# Patient Record
Sex: Female | Born: 1985 | Hispanic: Yes | Marital: Married | Smoking: Never smoker
Health system: Southern US, Community
[De-identification: ages and names within clinical notes are randomized; demographics above are authoritative.]

## PROBLEM LIST (undated history)

## (undated) DIAGNOSIS — Z789 Other specified health status: Secondary | ICD-10-CM

---

## 2008-06-11 ENCOUNTER — Emergency Department: Payer: Self-pay | Admitting: Emergency Medicine

## 2012-07-08 ENCOUNTER — Ambulatory Visit: Payer: Self-pay | Admitting: Primary Care

## 2012-11-18 ENCOUNTER — Ambulatory Visit: Payer: Self-pay | Admitting: Family Medicine

## 2012-11-22 IMAGING — US US OB < 14 WEEKS - US OB TV
1 series · 14 of 28 positions shown · non-contrast
Comparison: none

REASON FOR EXAM: Call report  [id number]  6 wks  viability spotting
pelvic pain
COMMENTS:

PROCEDURE:     US  - US OB LESS THAN 14 WEEKS/W TRANS  - July 08, 2012  [DATE]
RESULT:

[Series 1: us ob < 14 weeks - us ob tv · 0.26mm/px · 14 of 85 slices shown]
[im 4/85]
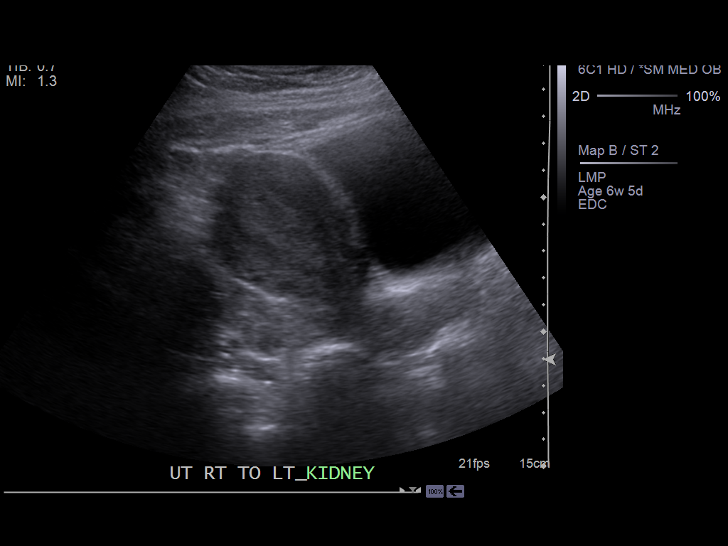
[im 10/85]
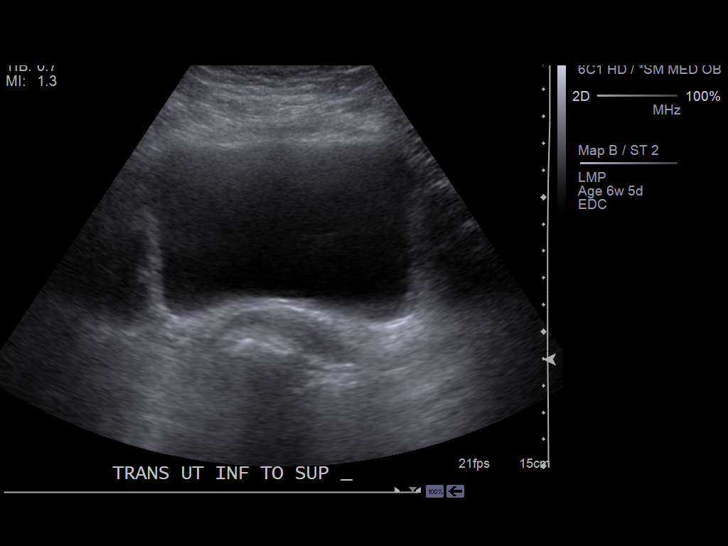
[im 16/85]
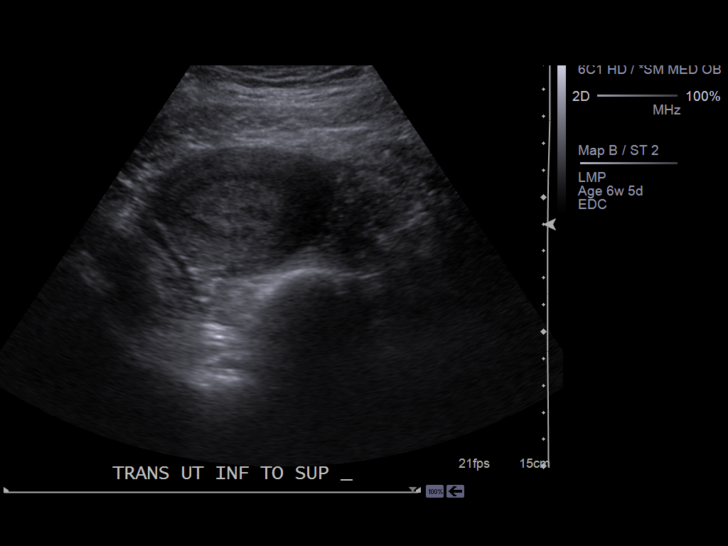
[im 22/85]
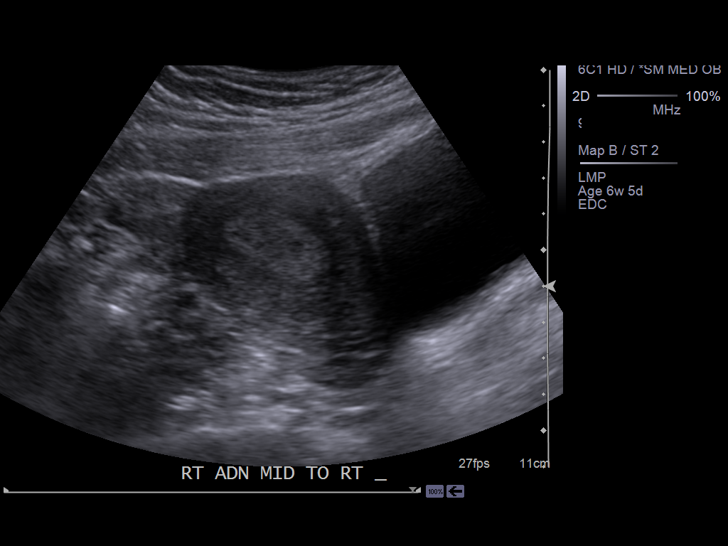
[im 29/85]
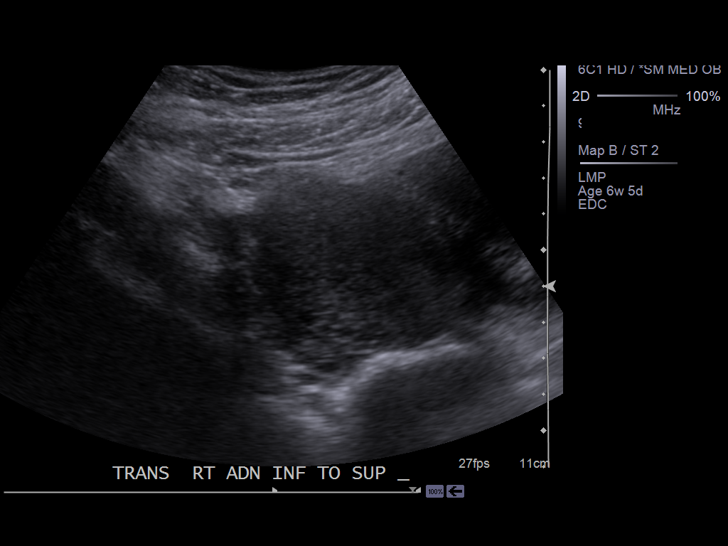
[im 35/85]
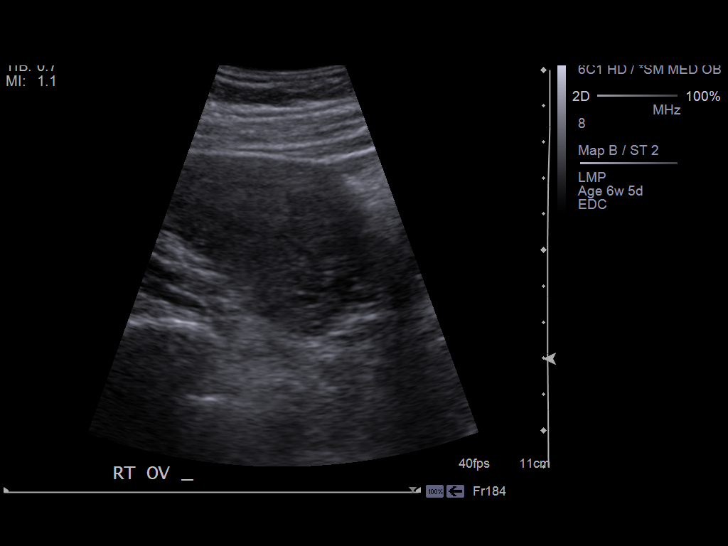
[im 41/85]
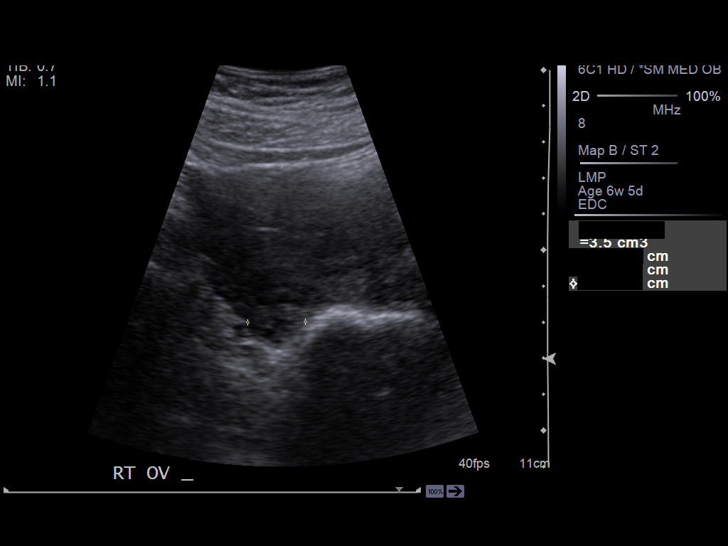
[im 47/85]
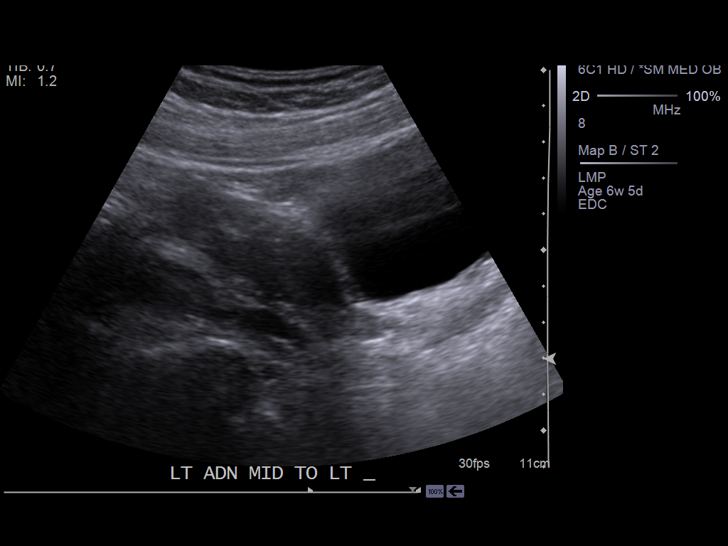
[im 53/85]
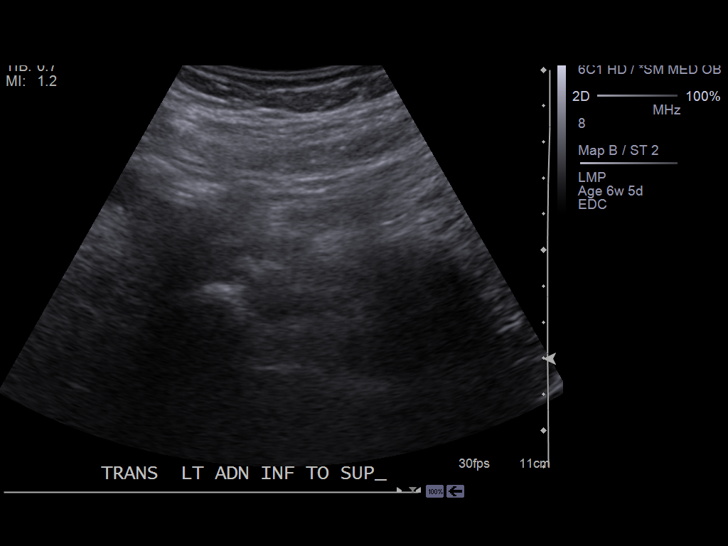
[im 60/85]
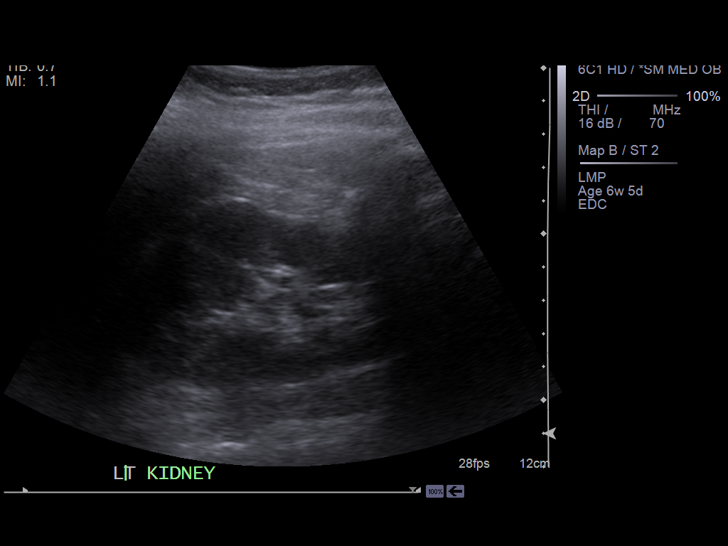
[im 66/85]
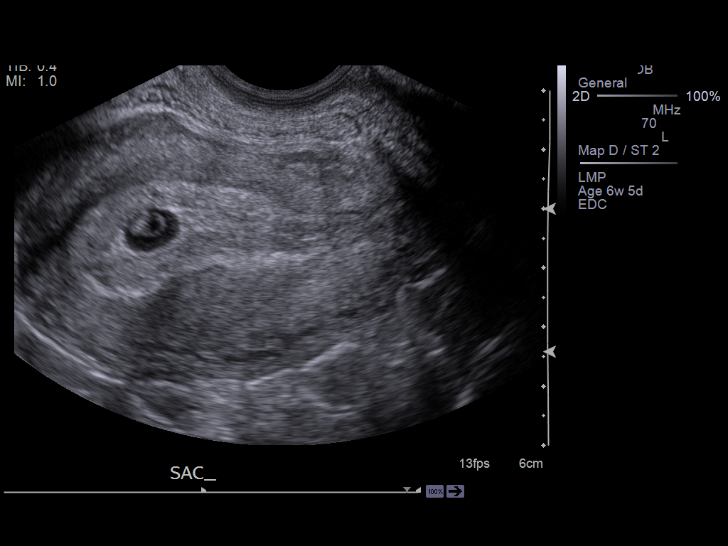
[im 72/85]
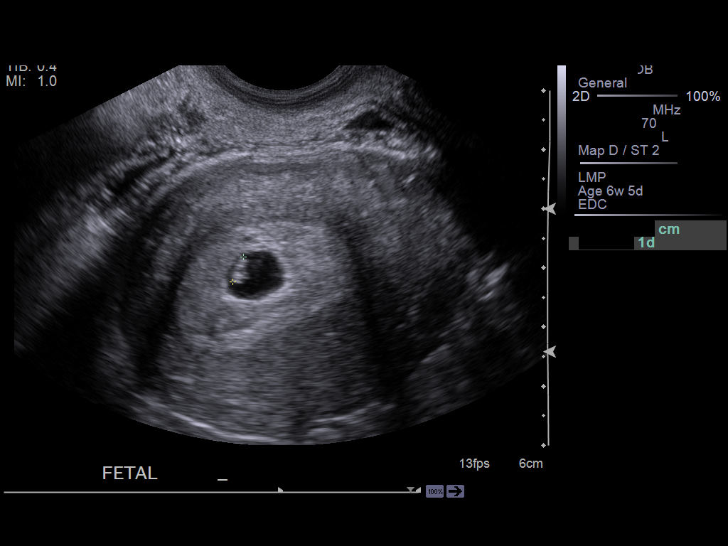
[im 78/85]
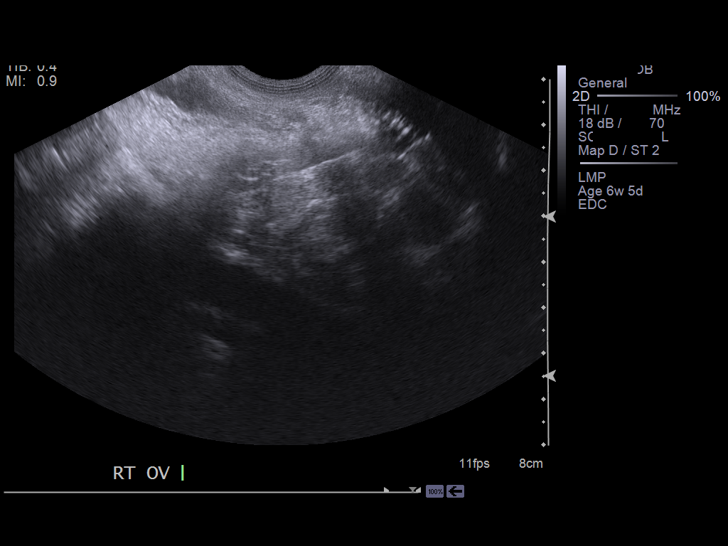
[im 85/85]
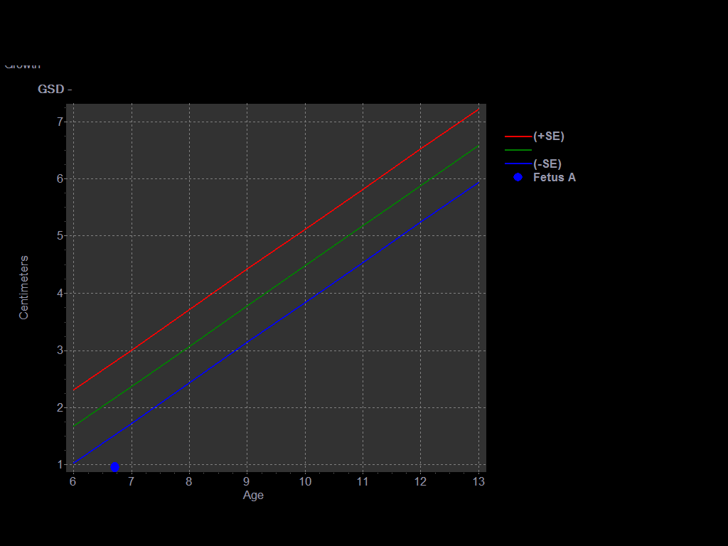

[14 of 28 positions shown; findings below may reference images not displayed]

FINDINGS: Single, viable intrauterine pregnancy is appreciated. Fetal heart
rate is 118 beats per minute. Estimated gestational age is 6 weeks, 1 day. A
yolk sac is identified. There is no evidence of adnexal masses or cysts.
There is no evidence of cul-de-sac fluid. The ovaries are unremarkable.
IMPRESSION: Single, viable intrauterine pregnancy as described above.

## 2013-04-04 IMAGING — US TRANSABDOMINAL ULTRASOUND OF PELVIS
1 series · 14 of 25 positions shown · non-contrast
Comparison: none

REASON FOR EXAM: Dysmenorrhea
COMMENTS:

[Series 1: transabdominal ultrasound of pelvis · 0.28mm/px · 14 of 79 slices shown]
[im 1/79]
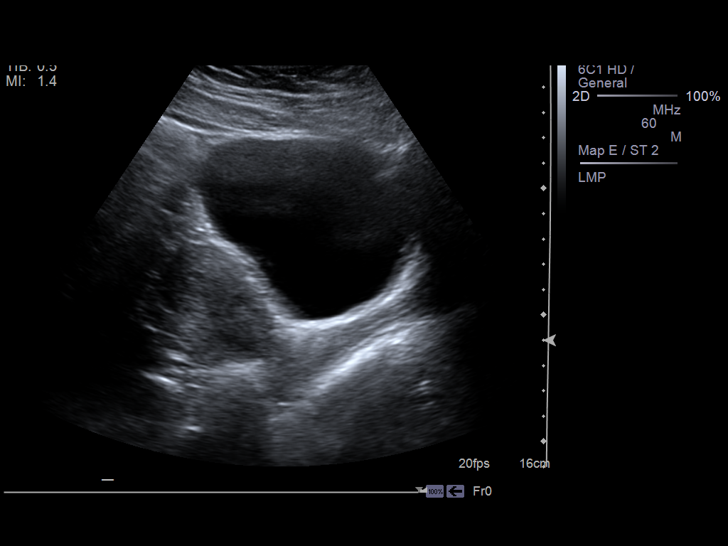
[im 7/79]
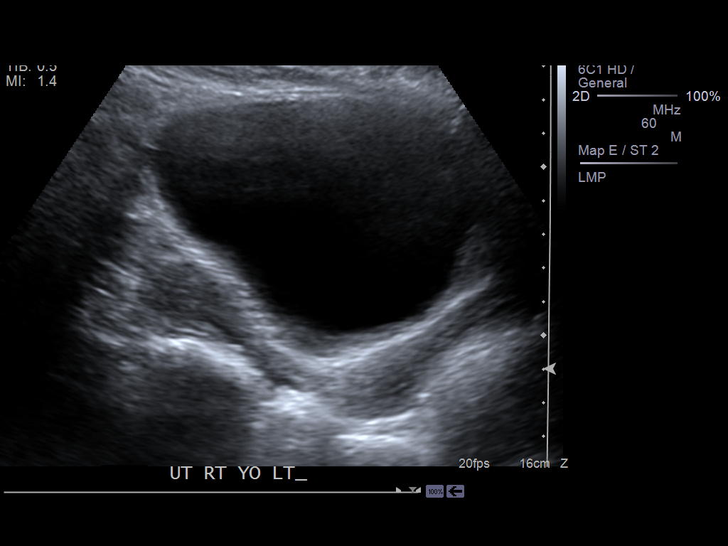
[im 14/79]
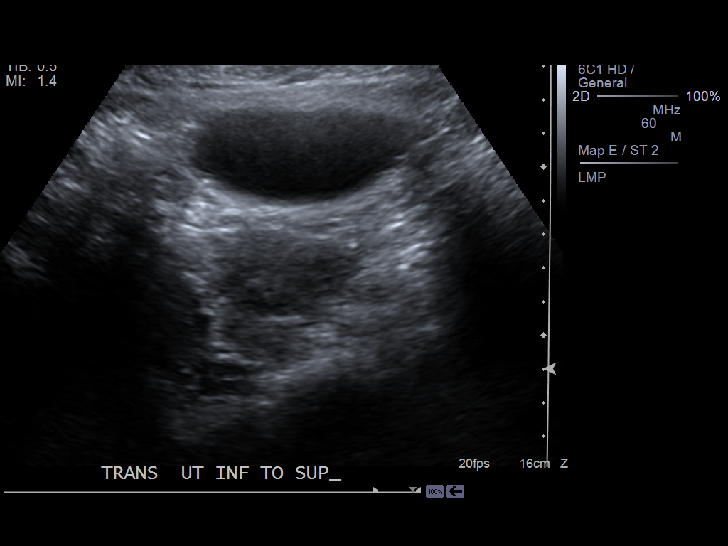
[im 20/79]
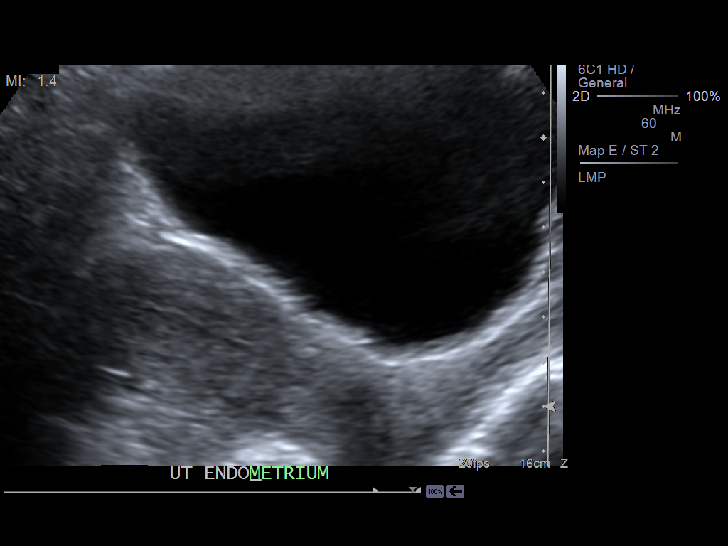
[im 27/79]
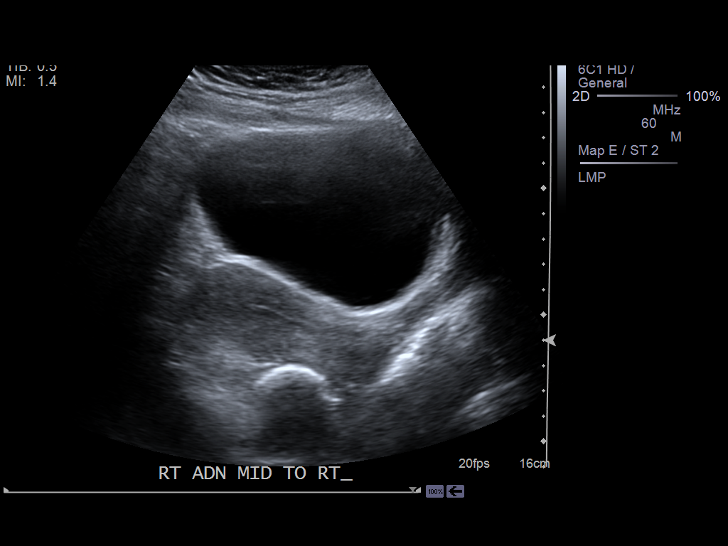
[im 30/79]
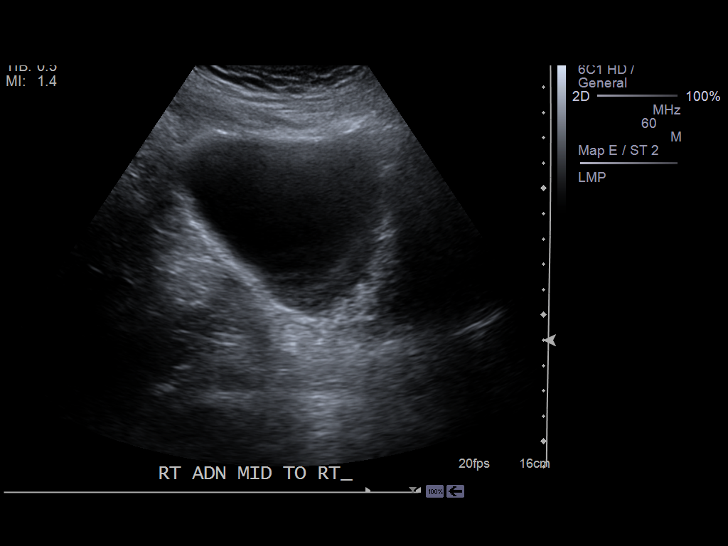
[im 36/79]
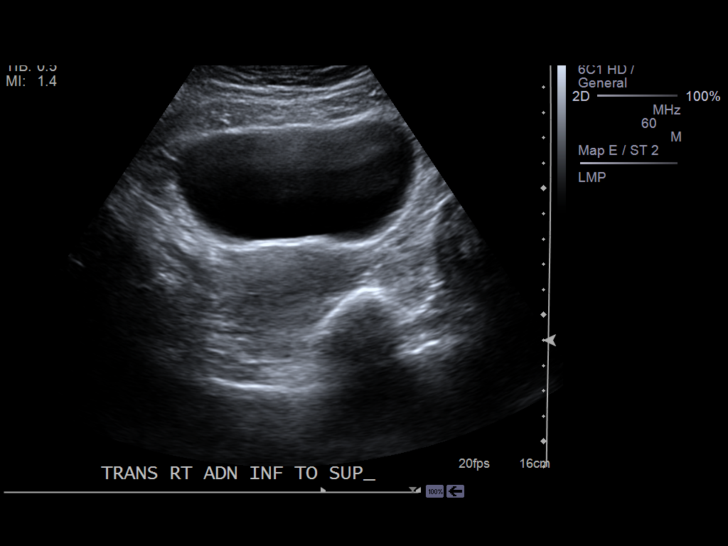
[im 43/79]
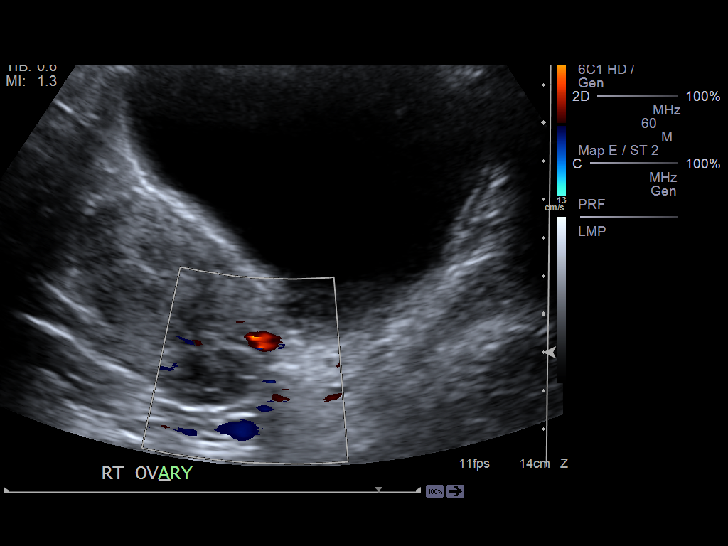
[im 49/79]
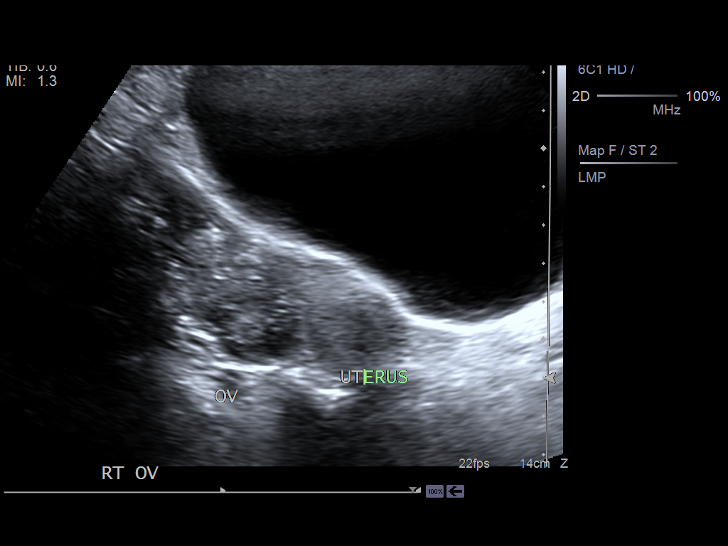
[im 53/79]
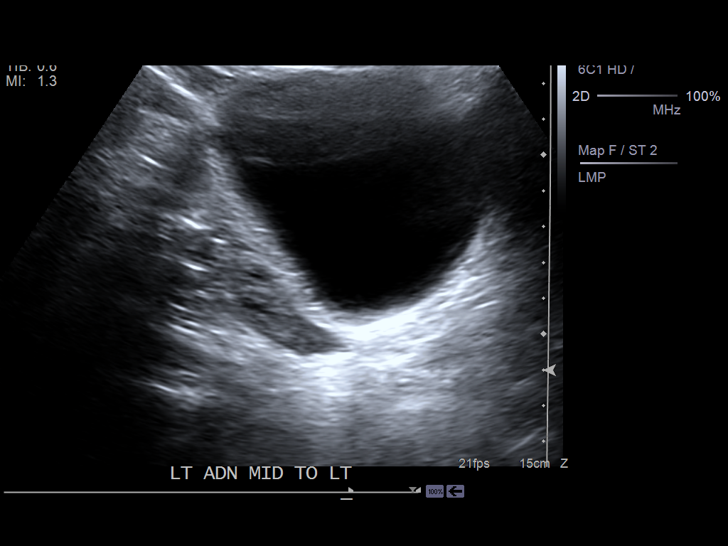
[im 59/79]
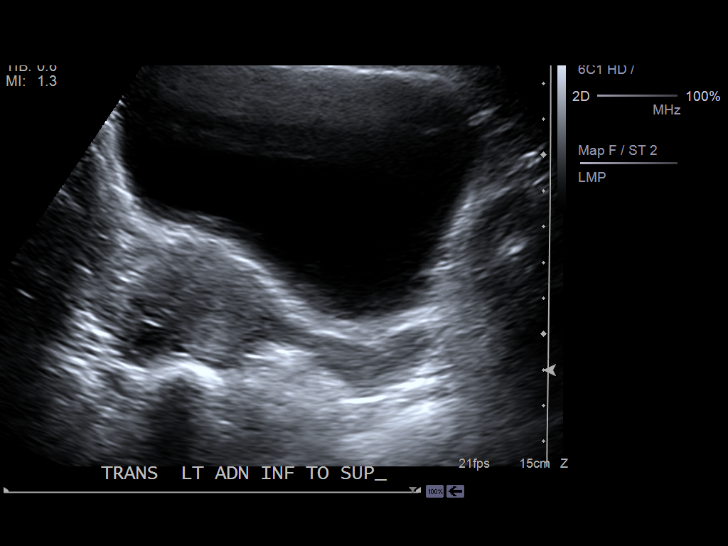
[im 66/79]
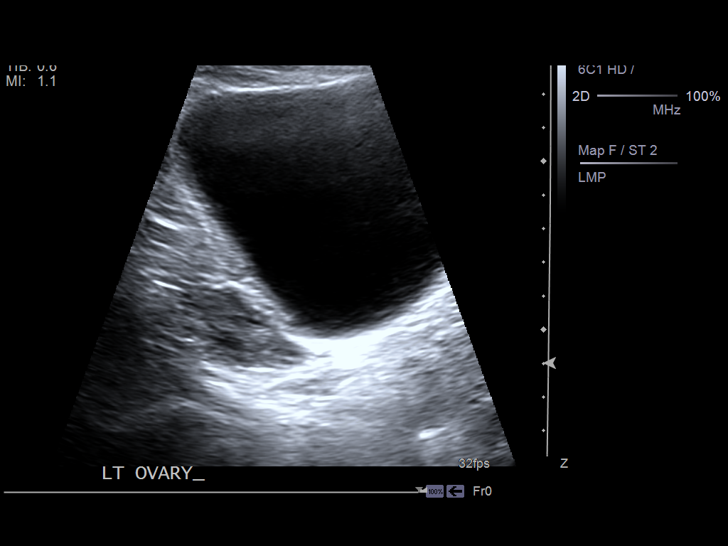
[im 72/79]
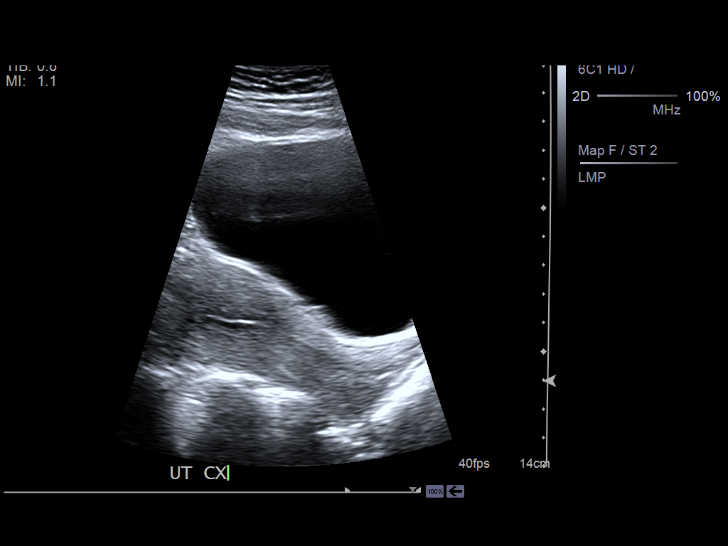
[im 79/79]
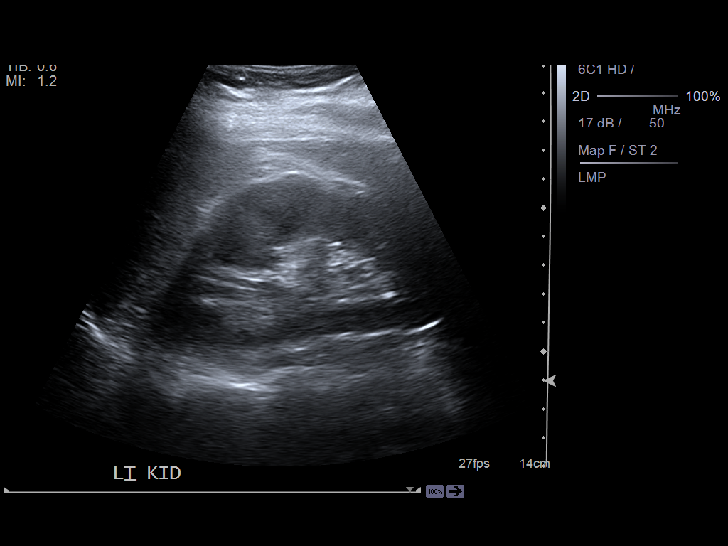

[14 of 25 positions shown; findings below may reference images not displayed]

PROCEDURE:     US  - US PELVIS EXAM  - November 18, 2012  [DATE]

RESULT:     Transabdominal imaging was performed through the pelvis.
Transvaginal techniques were not employed due to the fact that the anatomic
features were well demonstrated transabdominally.

The uterus is normal in echotexture and contour. It measures 8.4 x 3.4 x
cm. The endometrial stripe is normal at just over 10 mm. There is no free
fluid in the cul-de-sac.

The right ovary measures 2.9 x 1.6 x 2.6 cm. The left ovary measures 4.1 x
2.1 x 2.4 cm and is also normal in echotexture. Vascularity of the ovaries
is normal. There are no cystic or solid adnexal masses. Survey views of the
kidneys are normal.
IMPRESSION: Normal pelvic ultrasound examination.

[REDACTED]

## 2017-04-15 LAB — OB RESULTS CONSOLE RUBELLA ANTIBODY, IGM: RUBELLA: IMMUNE

## 2017-04-15 LAB — OB RESULTS CONSOLE RPR: RPR: NONREACTIVE

## 2017-04-15 LAB — OB RESULTS CONSOLE HEPATITIS B SURFACE ANTIGEN: Hepatitis B Surface Ag: NEGATIVE

## 2017-04-15 LAB — OB RESULTS CONSOLE HIV ANTIBODY (ROUTINE TESTING): HIV: NONREACTIVE

## 2017-04-15 LAB — OB RESULTS CONSOLE VARICELLA ZOSTER ANTIBODY, IGG: VARICELLA IGG: IMMUNE

## 2017-05-15 ENCOUNTER — Other Ambulatory Visit: Payer: Self-pay | Admitting: Primary Care

## 2017-05-15 DIAGNOSIS — Z3482 Encounter for supervision of other normal pregnancy, second trimester: Secondary | ICD-10-CM

## 2017-05-18 ENCOUNTER — Other Ambulatory Visit: Payer: Self-pay | Admitting: Primary Care

## 2017-05-18 DIAGNOSIS — Z3482 Encounter for supervision of other normal pregnancy, second trimester: Secondary | ICD-10-CM

## 2017-06-04 ENCOUNTER — Ambulatory Visit
Admission: RE | Admit: 2017-06-04 | Discharge: 2017-06-04 | Disposition: A | Discharge status: Home / Self Care | Payer: Self-pay | Source: Ambulatory Visit | Attending: Primary Care | Admitting: Primary Care

## 2017-06-04 DIAGNOSIS — Z3482 Encounter for supervision of other normal pregnancy, second trimester: Secondary | ICD-10-CM | POA: Insufficient documentation

## 2017-06-04 DIAGNOSIS — Z3A19 19 weeks gestation of pregnancy: Secondary | ICD-10-CM | POA: Insufficient documentation

## 2017-09-19 LAB — OB RESULTS CONSOLE GBS: STREP GROUP B AG: NEGATIVE

## 2017-10-20 ENCOUNTER — Other Ambulatory Visit: Payer: Self-pay | Admitting: Obstetrics and Gynecology

## 2017-10-20 ENCOUNTER — Inpatient Hospital Stay
Admission: AD | Admit: 2017-10-20 | Discharge: 2017-10-25 | DRG: 788 | Disposition: A | Discharge status: Home / Self Care | Payer: BLUE CROSS/BLUE SHIELD | Source: Ambulatory Visit | Attending: Certified Nurse Midwife | Admitting: Certified Nurse Midwife

## 2017-10-20 DIAGNOSIS — O99214 Obesity complicating childbirth: Secondary | ICD-10-CM | POA: Diagnosis present

## 2017-10-20 DIAGNOSIS — Z880 Allergy status to penicillin: Secondary | ICD-10-CM | POA: Diagnosis not present

## 2017-10-20 DIAGNOSIS — Z3A40 40 weeks gestation of pregnancy: Secondary | ICD-10-CM

## 2017-10-20 DIAGNOSIS — O480 Post-term pregnancy: Principal | ICD-10-CM | POA: Diagnosis present

## 2017-10-20 DIAGNOSIS — E669 Obesity, unspecified: Secondary | ICD-10-CM | POA: Diagnosis present

## 2017-10-20 DIAGNOSIS — O324XX0 Maternal care for high head at term, not applicable or unspecified: Secondary | ICD-10-CM | POA: Diagnosis present

## 2017-10-20 HISTORY — DX: Other specified health status: Z78.9

## 2017-10-20 MED ORDER — ONDANSETRON HCL 4 MG/2ML IJ SOLN: 4.0000 mg | Freq: Four times a day (QID) | INTRAMUSCULAR | Status: DC | PRN

## 2017-10-20 MED ORDER — LIDOCAINE HCL (PF) 1 % IJ SOLN: 30.0000 mL | INTRAMUSCULAR | Status: DC | PRN

## 2017-10-20 MED ORDER — LACTATED RINGERS IV SOLN
500.0000 mL | INTRAVENOUS | Status: DC | PRN
  Administered 2017-10-22 (×2): 500 mL via INTRAVENOUS

## 2017-10-20 MED ORDER — LACTATED RINGERS IV SOLN
INTRAVENOUS | Status: DC
  Administered 2017-10-21 – 2017-10-22 (×5): via INTRAVENOUS
  Administered 2017-10-22: 1000 mL via INTRAVENOUS
  Administered 2017-10-22: 03:00:00 via INTRAVENOUS

## 2017-10-20 MED ORDER — OXYTOCIN 40 UNITS IN LACTATED RINGERS INFUSION - SIMPLE MED
2.5000 [IU]/h | INTRAVENOUS | Status: DC
  Filled 2017-10-20 (×2): qty 1000

## 2017-10-20 MED ORDER — BUTORPHANOL TARTRATE 1 MG/ML IJ SOLN
1.0000 mg | INTRAMUSCULAR | Status: DC | PRN
  Administered 2017-10-21: 1 mg via INTRAVENOUS
  Filled 2017-10-20: qty 1

## 2017-10-20 MED ORDER — TERBUTALINE SULFATE 1 MG/ML IJ SOLN: 0.2500 mg | Freq: Once | INTRAMUSCULAR | Status: DC | PRN

## 2017-10-20 MED ORDER — OXYTOCIN BOLUS FROM INFUSION: 500.0000 mL | Freq: Once | INTRAVENOUS | Status: DC

## 2017-10-20 MED ORDER — ACETAMINOPHEN 325 MG PO TABS: 650.0000 mg | ORAL_TABLET | ORAL | Status: DC | PRN

## 2017-10-20 MED ORDER — SOD CITRATE-CITRIC ACID 500-334 MG/5ML PO SOLN
30.0000 mL | ORAL | Status: DC | PRN
  Administered 2017-10-22: 30 mL via ORAL
  Filled 2017-10-20: qty 15

## 2017-10-21 ENCOUNTER — Inpatient Hospital Stay: Payer: BLUE CROSS/BLUE SHIELD | Admitting: Anesthesiology

## 2017-10-21 ENCOUNTER — Other Ambulatory Visit: Payer: Self-pay

## 2017-10-21 LAB — CBC
HEMATOCRIT: 39.3 % (ref 35.0–47.0)
Hemoglobin: 13.2 g/dL (ref 12.0–16.0)
MCH: 35.1 pg — AB (ref 26.0–34.0)
MCHC: 33.6 g/dL (ref 32.0–36.0)
MCV: 104.7 fL — AB (ref 80.0–100.0)
PLATELETS: 130 10*3/uL — AB (ref 150–440)
RBC: 3.76 MIL/uL — ABNORMAL LOW (ref 3.80–5.20)
RDW: 13.7 % (ref 11.5–14.5)
WBC: 8.2 10*3/uL (ref 3.6–11.0)

## 2017-10-21 LAB — TYPE AND SCREEN
ABO/RH(D): A POS
Antibody Screen: NEGATIVE

## 2017-10-21 MED ORDER — PHENYLEPHRINE 40 MCG/ML (10ML) SYRINGE FOR IV PUSH (FOR BLOOD PRESSURE SUPPORT): 80.0000 ug | PREFILLED_SYRINGE | INTRAVENOUS | Status: DC | PRN

## 2017-10-21 MED ORDER — LIDOCAINE HCL (PF) 2 % IJ SOLN
INTRAMUSCULAR | Status: DC | PRN
  Administered 2017-10-21: 5 mL via INTRADERMAL

## 2017-10-21 MED ORDER — AMMONIA AROMATIC IN INHA
RESPIRATORY_TRACT | Status: AC
  Filled 2017-10-21: qty 10

## 2017-10-21 MED ORDER — MISOPROSTOL 25 MCG QUARTER TABLET
25.0000 ug | ORAL_TABLET | ORAL | Status: DC
  Administered 2017-10-21 (×2): 25 ug via VAGINAL
  Filled 2017-10-21 (×4): qty 1

## 2017-10-21 MED ORDER — EPHEDRINE 5 MG/ML INJ: 10.0000 mg | INTRAVENOUS | Status: DC | PRN

## 2017-10-21 MED ORDER — MISOPROSTOL 200 MCG PO TABS
ORAL_TABLET | ORAL | Status: AC
  Filled 2017-10-21: qty 4

## 2017-10-21 MED ORDER — OXYTOCIN 40 UNITS IN LACTATED RINGERS INFUSION - SIMPLE MED
INTRAVENOUS | Status: AC
  Filled 2017-10-21: qty 1000

## 2017-10-21 MED ORDER — LIDOCAINE HCL (PF) 1 % IJ SOLN
INTRAMUSCULAR | Status: AC
  Filled 2017-10-21: qty 30

## 2017-10-21 MED ORDER — OXYTOCIN 40 UNITS IN LACTATED RINGERS INFUSION - SIMPLE MED
1.0000 m[IU]/min | INTRAVENOUS | Status: DC
  Administered 2017-10-21: 2 m[IU]/min via INTRAVENOUS
  Administered 2017-10-22: 20 m[IU]/min via INTRAVENOUS
  Administered 2017-10-22: 500 mL via INTRAVENOUS
  Administered 2017-10-22: 2 m[IU]/min via INTRAVENOUS
  Filled 2017-10-21: qty 1000

## 2017-10-21 MED ORDER — TERBUTALINE SULFATE 1 MG/ML IJ SOLN: 0.2500 mg | Freq: Once | INTRAMUSCULAR | Status: DC | PRN

## 2017-10-21 MED ORDER — DIPHENHYDRAMINE HCL 50 MG/ML IJ SOLN: 12.5000 mg | INTRAMUSCULAR | Status: DC | PRN

## 2017-10-21 MED ORDER — BUPIVACAINE HCL (PF) 0.25 % IJ SOLN
INTRAMUSCULAR | Status: DC | PRN
  Administered 2017-10-21: 10 mL via EPIDURAL

## 2017-10-21 MED ORDER — FENTANYL 2.5 MCG/ML W/ROPIVACAINE 0.15% IN NS 100 ML EPIDURAL (ARMC)
12.0000 mL/h | EPIDURAL | Status: DC
  Administered 2017-10-21 – 2017-10-22 (×4): 12 mL/h via EPIDURAL
  Filled 2017-10-21 (×3): qty 100

## 2017-10-21 MED ORDER — OXYTOCIN 10 UNIT/ML IJ SOLN
INTRAMUSCULAR | Status: AC
  Filled 2017-10-21: qty 2

## 2017-10-21 MED ORDER — LACTATED RINGERS IV SOLN: 500.0000 mL | Freq: Once | INTRAVENOUS | Status: DC

## 2017-10-21 MED ORDER — FENTANYL 2.5 MCG/ML W/ROPIVACAINE 0.15% IN NS 100 ML EPIDURAL (ARMC)
EPIDURAL | Status: AC
  Filled 2017-10-21: qty 100

## 2017-10-21 MED ORDER — LIDOCAINE HCL (PF) 1 % IJ SOLN
INTRAMUSCULAR | Status: DC | PRN
  Administered 2017-10-21: 3 mL

## 2017-10-21 NOTE — Progress Notes (Signed)
Labor Progress Note  Kristin Pineda is a 32 y.o. G3P0020 at 883w6d by ultrasound admitted for induction of labor due to late term.  Subjective: feeling mild cramping only.   Objective: BP 118/66 (BP Location: Left Arm)   Pulse 81   Temp (!) 97.5 F (36.4 C) (Oral)   Resp 14   Ht 5' (1.524 m)   Wt 155 lb (70.3 kg)   BMI 30.27 kg/m  Notable VS details: reviewed.   Fetal Assessment: FHT:  FHR: 135 bpm, variability: moderate,  accelerations:  Present,  decelerations:  Absent Category/reactivity:  Category I UC:   regular, every 2-4 minutes SVE:   3/60/-1; medium consistency, posterior. No bloody show.  Membranes: Intact  Labs: Lab Results  Component Value Date   WBC 8.2 10/21/2017   HGB 13.2 10/21/2017   HCT 39.3 10/21/2017   MCV 104.7 (H) 10/21/2017   PLT 130 (L) 10/21/2017    Assessment / Plan: G3P0 at 1023w6d; IOL for late term.   Labor: s/p 2 doses cytotec, contracting well, will start Pitocin and plan for AROM.  Fetal Wellbeing:  Category I; lates recurrent for about 20min, now resolved. Pt was flat on back, then after up to BR, lates resolved.  Pain Control:  Labor support without medications I/D:  n/a Anticipated MOD:  NSVD  McVey, REBECCA A, CNM 10/21/2017, 12:24 PM

## 2017-10-21 NOTE — Progress Notes (Signed)
Labor Progress Note  Kristin Pineda is a 32 y.o. G3P0020 at 4134w6d by ultrasound admitted for induction of labor due to late term.   Subjective: feeling cramping with UCs, denies need for pain meds or epidural at this time.   Objective: BP 121/65 (BP Location: Right Arm)   Pulse 64   Temp 98.3 F (36.8 C) (Oral)   Resp 14   Ht 5' (1.524 m)   Wt 155 lb (70.3 kg)   BMI 30.27 kg/m  Notable VS details: reviewed.   Fetal Assessment: FHT:  FHR: 130 bpm, variability: moderate,  accelerations:  Present,  decelerations:  Absent Category/reactivity:  Category I UC:   regular, every 2-3 minutes; pitocin at 8214mu/min SVE:   3/60/-1; soft/mid-position  Amniotic color: AROM performed, clear fluid.   Labs: Lab Results  Component Value Date   WBC 8.2 10/21/2017   HGB 13.2 10/21/2017   HCT 39.3 10/21/2017   MCV 104.7 (H) 10/21/2017   PLT 130 (L) 10/21/2017    Assessment / Plan: G3P0 at 3334w6d; IOL for late term.     Labor: s/p 2 doses cytotec, PItocin approx 5hrs with minimal cervical change. AROM now clear fluid.  Fetal Wellbeing:  Category I Pain Control:  now requesting epidural after AROM.  I/D:  n/a Anticipated MOD:  NSVD  Margeaux Swantek A, CNM 10/21/2017, 5:58 PM

## 2017-10-21 NOTE — Anesthesia Procedure Notes (Signed)
Epidural Patient location during procedure: OB Start time: 10/21/2017 6:54 PM End time: 10/21/2017 7:04 PM  Staffing Anesthesiologist: Yves Dillarroll, Brandon Scarbrough, MD Performed: anesthesiologist   Preanesthetic Checklist Completed: patient identified, site marked, surgical consent, pre-op evaluation, timeout performed, IV checked, risks and benefits discussed and monitors and equipment checked  Epidural Patient position: sitting Prep: Betadine Patient monitoring: heart rate, continuous pulse ox and blood pressure Approach: midline Location: L3-L4 Injection technique: LOR air  Needle:  Needle type: Tuohy  Needle gauge: 17 G Needle length: 9 cm and 9 Catheter type: closed end flexible Catheter size: 19 Gauge Test dose: negative and 1.5% lidocaine with Epi 1:200 K  Assessment Events: blood not aspirated, injection not painful, no injection resistance, negative IV test and no paresthesia  Additional Notes Time out called.  Translator present.  Patient placed in sitting position.  Back prepped and draped in sterile fashion.  A skin wheal was made in the L3-L4 interspace with 1% Lidocaine plain.  A 17G Tuohy needle was advanced into the epidural space by a loss of resistance technique.  The epidural catheter was threaded 3 cm with no paresthesias.  The patient tolerated the procedure well.  The catheter was affixed to the back in sterile fashion.Reason for block:procedure for pain

## 2017-10-21 NOTE — OB Triage Note (Signed)
Patient presented to L&D for scheduled IOL for postdates.   

## 2017-10-21 NOTE — Progress Notes (Signed)
Utilized Stratus video interpreter to assess the patient, explain epidural process to include turning side to side and foley catheter insertion, as well as plan of care. Pt and husband and friend verbalized understanding.

## 2017-10-21 NOTE — H&P (Signed)
OB History & Physical   History of Present Illness:  Chief Complaint: presented to L&D for scheduled post-dates IOL.   HPI:  Kristin Pineda is Pineda 32 y.o. G71P0020 female at [redacted]w[redacted]d dated by 21wk anatomy US, not c/w LMP (01/25/17). dating . Estimated Date of Delivery: 10/15/17. She presents to L&D for induction of labor  Active FM Denies UCs on admission, now having mild crmaping. Denies need for pain meds at this time.  Denies VB or LOF.     Pregnancy Issues: 1. Overweight    Maternal Medical History:   Past Medical History:  Diagnosis Date  . Medical history non-contributory   G3 P0, 2 uncomplicated SABs, 2009, 03/2012.  History reviewed. No pertinent surgical history.  Allergies  Allergen Reactions  . Penicillins Hives    Prior to Admission medications   Medication Sig Start Date End Date Taking? Authorizing Provider  acetaminophen (TYLENOL) 325 MG tablet Take 325 mg by mouth every 6 (six) hours as needed for headache.   Yes [provider]  Prenatal Vit-Fe Fumarate-FA (MULTIVITAMIN-PRENATAL) 27-0.8 MG TABS tablet Take 1 tablet by mouth daily at 12 noon.   Yes [provider]     Prenatal care site:  [facility]  Social History: She  reports that  has never smoked. she has never used smokeless tobacco. She reports that she does not drink alcohol or use drugs.  Family History: family history is not on file.   Review of Systems: Pineda full review of systems was performed and negative except as noted in the HPI.     Physical Exam:  Vital Signs: BP 110/66   Pulse 69   Temp 98.2 F (36.8 C) (Oral)   Resp 16   Ht 5' (1.524 m)   Wt 155 lb (70.3 kg)   BMI 30.27 kg/m  General: no acute distress.  HEENT: normocephalic, atraumatic Heart: regular rate & rhythm.  No murmurs/rubs/gallops Lungs: clear to auscultation bilaterally, normal respiratory effort Abdomen: soft, gravid, non-tender;  EFW: 7.5lbs Pelvic:   External: Normal external female  genitalia  Cervix: Dilation: 2 / Effacement (%): 50 / Station: -1    Extremities: non-tender, symmetric, no edema bilaterally.  DTRs: 2+  Neurologic: Alert & oriented x 3.    Results for orders placed or performed during the hospital encounter of 10/20/17 (from the past 24 hour(s))  CBC     Status: Abnormal   Collection Time: 10/21/17 12:08 AM  Result Value Ref Range   WBC 8.2 3.6 - 11.0 K/uL   RBC 3.76 (L) 3.80 - 5.20 MIL/uL   Hemoglobin 13.2 12.0 - 16.0 g/dL   HCT 40.9 81.1 - 91.4 %   MCV 104.7 (H) 80.0 - 100.0 fL   MCH 35.1 (H) 26.0 - 34.0 pg   MCHC 33.6 32.0 - 36.0 g/dL   RDW 78.2 95.6 - 21.3 %   Platelets 130 (L) 150 - 440 K/uL  Type and screen     Status: None   Collection Time: 10/21/17 12:08 AM  Result Value Ref Range   ABO/RH(D) Pineda POS    Antibody Screen NEG    Sample Expiration      10/24/2017 Performed at [facility], [address], [place], [place] [address]     Pertinent Results:  Prenatal Labs: Blood type/Rh Pineda Pos  Antibody screen neg  Rubella Immune  Varicella Immune  RPR NR  HBsAg Neg  HIV NR  GC neg  Chlamydia neg  1 hour GTT 124  GBS neg   FHT:Cat I tracing, 130bpm, moderate variability, + accels, no decels TOCO: q3-415min SVE:  Dilation: 2 / Effacement (%): 50 / Station: -1    Cephalic by leopolds  No results found.  Assessment:  Kristin Pineda is Pineda 32 y.o. 553P0020 female at 2194w6d with IOL for late term.   Plan:  1. Admit to Labor & Delivery; consents reviewed and obtained  2. Fetal Well being  - Fetal Tracing: Cat I - Group B Streptococcus ppx indicated: neg - Presentation: cephalic confirmed by exam   3. Routine OB: - Prenatal labs reviewed, as above - Rh Pineda Pos - CBC, T&S, RPR on admit - Clear fluids, IVF  4. Monitoring of Induction of Labor -  Contractions: external toco in place -  Pelvis unproven, adequate for trial of labor. -  Plan for induction: cytotec 2nd dose placed -  Plan for continuous fetal monitoring   -  Maternal pain control as desired; discussed options including regional anesthesia, IVPM and comfort measures. Encouraged ambulation and frequent postiion changes.  - Anticipate vaginal delivery  5. Post Partum Planning: - Infant feeding: breast - Contraception: pending  Kristin Pineda, CNM 10/21/17 8:25 AM

## 2017-10-21 NOTE — Progress Notes (Addendum)
Dr. Noralyn Pickarroll requested that the pitocin be reduced to 8 miliunits during the placement of the epidural.

## 2017-10-21 NOTE — Anesthesia Preprocedure Evaluation (Signed)
Anesthesia Evaluation  Patient identified by MRN, date of birth, ID band Patient awake    Reviewed: Allergy & Precautions, NPO status , Patient's Chart, lab work & pertinent test results  Airway Mallampati: II  TM Distance: >3 FB     Dental  (+) Teeth Intact   Pulmonary neg pulmonary ROS,    Pulmonary exam normal        Cardiovascular negative cardio ROS Normal cardiovascular exam     Neuro/Psych negative neurological ROS  negative psych ROS   GI/Hepatic negative GI ROS, Neg liver ROS,   Endo/Other  negative endocrine ROS  Renal/GU negative Renal ROS  negative genitourinary   Musculoskeletal negative musculoskeletal ROS (+)   Abdominal Normal abdominal exam  (+)   Peds negative pediatric ROS (+)  Hematology negative hematology ROS (+)   Anesthesia Other Findings   Reproductive/Obstetrics (+) Pregnancy                             Anesthesia Physical Anesthesia Plan  ASA: II  Anesthesia Plan: Epidural   Post-op Pain Management:    Induction:   PONV Risk Score and Plan:   Airway Management Planned: Natural Airway  Additional Equipment:   Intra-op Plan:   Post-operative Plan:   Informed Consent: I have reviewed the patients History and Physical, chart, labs and discussed the procedure including the risks, benefits and alternatives for the proposed anesthesia with the patient or authorized representative who has indicated his/her understanding and acceptance.   Dental advisory given  Plan Discussed with: CRNA and Surgeon  Anesthesia Plan Comments:         Anesthesia Quick Evaluation  

## 2017-10-22 ENCOUNTER — Encounter
Admission: AD | Disposition: A | Discharge status: Home / Self Care | Payer: Self-pay | Source: Ambulatory Visit | Attending: Certified Nurse Midwife

## 2017-10-22 LAB — RPR: RPR: NONREACTIVE

## 2017-10-22 SURGERY — Surgical Case
Anesthesia: Epidural | Site: Abdomen | Wound class: Clean Contaminated

## 2017-10-22 MED ORDER — BUPIVACAINE HCL (PF) 0.25 % IJ SOLN: INTRAMUSCULAR | Status: DC | PRN

## 2017-10-22 MED ORDER — METHYLERGONOVINE MALEATE 0.2 MG/ML IJ SOLN
INTRAMUSCULAR | Status: AC
  Filled 2017-10-22: qty 1

## 2017-10-22 MED ORDER — BUPIVACAINE HCL (PF) 0.5 % IJ SOLN
INTRAMUSCULAR | Status: DC | PRN
  Administered 2017-10-22: 25 mL

## 2017-10-22 MED ORDER — ONDANSETRON HCL 4 MG/2ML IJ SOLN
INTRAMUSCULAR | Status: AC
  Filled 2017-10-22: qty 2

## 2017-10-22 MED ORDER — GENTAMICIN SULFATE 40 MG/ML IJ SOLN
5.0000 mg/kg | INTRAVENOUS | Status: AC
  Administered 2017-10-22: 280 mg via INTRAVENOUS
  Filled 2017-10-22 (×2): qty 7

## 2017-10-22 MED ORDER — FENTANYL CITRATE (PF) 100 MCG/2ML IJ SOLN
INTRAMUSCULAR | Status: DC | PRN
  Administered 2017-10-22: 100 ug via EPIDURAL

## 2017-10-22 MED ORDER — LACTATED RINGERS IV SOLN
INTRAVENOUS | Status: DC | PRN
  Administered 2017-10-22: 22:00:00 via INTRAVENOUS

## 2017-10-22 MED ORDER — LIDOCAINE 2% (20 MG/ML) 5 ML SYRINGE
INTRAMUSCULAR | Status: DC | PRN
  Administered 2017-10-22 (×3): 5 mg via INTRAVENOUS
  Administered 2017-10-22: 3 mg via INTRAVENOUS

## 2017-10-22 MED ORDER — BUPIVACAINE HCL (PF) 0.5 % IJ SOLN
INTRAMUSCULAR | Status: AC
  Filled 2017-10-22: qty 30

## 2017-10-22 MED ORDER — FENTANYL CITRATE (PF) 100 MCG/2ML IJ SOLN
INTRAMUSCULAR | Status: AC
  Filled 2017-10-22: qty 2

## 2017-10-22 MED ORDER — CLINDAMYCIN PHOSPHATE 900 MG/50ML IV SOLN
900.0000 mg | INTRAVENOUS | Status: AC
  Administered 2017-10-22: 900 mg via INTRAVENOUS
  Filled 2017-10-22: qty 50

## 2017-10-22 MED ORDER — BUPIVACAINE LIPOSOME 1.3 % IJ SUSP
20.0000 mL | Freq: Once | INTRAMUSCULAR | Status: DC
  Filled 2017-10-22: qty 20

## 2017-10-22 MED ORDER — PHENYLEPHRINE HCL 10 MG/ML IJ SOLN
INTRAMUSCULAR | Status: DC | PRN
  Administered 2017-10-22 (×5): 100 ug via INTRAVENOUS

## 2017-10-22 MED ORDER — ONDANSETRON HCL 4 MG/2ML IJ SOLN
INTRAMUSCULAR | Status: DC | PRN
  Administered 2017-10-22: 4 mg via INTRAVENOUS

## 2017-10-22 MED ORDER — MORPHINE SULFATE (PF) 0.5 MG/ML IJ SOLN
INTRAMUSCULAR | Status: DC | PRN
  Administered 2017-10-22: 3 mg via EPIDURAL

## 2017-10-22 MED ORDER — SODIUM CHLORIDE 0.9 % IV SOLN
500.0000 mg | INTRAVENOUS | Status: AC
  Administered 2017-10-22: 500 mg via INTRAVENOUS
  Filled 2017-10-22 (×2): qty 500

## 2017-10-22 MED ORDER — MORPHINE SULFATE (PF) 0.5 MG/ML IJ SOLN
INTRAMUSCULAR | Status: AC
  Filled 2017-10-22: qty 10

## 2017-10-22 MED ORDER — SODIUM CHLORIDE 0.9 % IV SOLN
INTRAVENOUS | Status: DC | PRN
  Administered 2017-10-22: 65 mL

## 2017-10-22 MED ORDER — METHYLERGONOVINE MALEATE 0.2 MG/ML IJ SOLN
INTRAMUSCULAR | Status: DC | PRN
  Administered 2017-10-22: 0.2 mg via INTRAMUSCULAR

## 2017-10-22 MED ORDER — DIPHENHYDRAMINE HCL 50 MG/ML IJ SOLN
50.0000 mg | Freq: Once | INTRAMUSCULAR | Status: AC
  Administered 2017-10-22: 50 mg via INTRAVENOUS
  Filled 2017-10-22: qty 1

## 2017-10-22 MED ORDER — SODIUM CHLORIDE 0.9 % IJ SOLN
INTRAMUSCULAR | Status: AC
  Filled 2017-10-22: qty 50

## 2017-10-22 SURGICAL SUPPLY — 27 items
BARRIER ADHS 3X4 INTERCEED (GAUZE/BANDAGES/DRESSINGS) ×3 IMPLANT
CANISTER SUCT 3000ML PPV (MISCELLANEOUS) ×3 IMPLANT
CHLORAPREP W/TINT 26ML (MISCELLANEOUS) ×6 IMPLANT
DRSG TELFA 3X8 NADH (GAUZE/BANDAGES/DRESSINGS) ×3 IMPLANT
ELECT REM PT RETURN 9FT ADLT (ELECTROSURGICAL) ×3
ELECTRODE REM PT RTRN 9FT ADLT (ELECTROSURGICAL) ×1 IMPLANT
GAUZE SPONGE 4X4 12PLY STRL (GAUZE/BANDAGES/DRESSINGS) ×3 IMPLANT
GLOVE BIOGEL M STRL SZ7.5 (GLOVE) ×3 IMPLANT
GLOVE INDICATOR 7.5 STRL GRN (GLOVE) ×9 IMPLANT
GLOVE PROTEXIS LATEX SZ 7.5 (GLOVE) ×6 IMPLANT
GOWN STRL REUS W/ TWL LRG LVL3 (GOWN DISPOSABLE) ×3 IMPLANT
GOWN STRL REUS W/TWL LRG LVL3 (GOWN DISPOSABLE) ×6
NS IRRIG 1000ML POUR BTL (IV SOLUTION) ×3 IMPLANT
PAD OB MATERNITY 4.3X12.25 (PERSONAL CARE ITEMS) ×6 IMPLANT
PAD PREP 24X41 OB/GYN DISP (PERSONAL CARE ITEMS) ×6 IMPLANT
SPONGE LAP 18X18 5 PK (GAUZE/BANDAGES/DRESSINGS) ×6 IMPLANT
STAPLER INSORB 30 2030 C-SECTI (MISCELLANEOUS) ×3 IMPLANT
SUT MNCRL 4-0 (SUTURE)
SUT MNCRL 4-0 27XMFL (SUTURE)
SUT PDS AB 1 TP1 96 (SUTURE) ×3 IMPLANT
SUT PLAIN 2 0 XLH (SUTURE) ×3 IMPLANT
SUT PLAIN GUT 2-0 30 C14 SG823 (SUTURE) ×3
SUT VIC AB 0 CT1 36 (SUTURE) ×9 IMPLANT
SUT VIC AB 3-0 SH 27 (SUTURE) ×2
SUT VIC AB 3-0 SH 27X BRD (SUTURE) ×1 IMPLANT
SUTURE MNCRL 4-0 27XMF (SUTURE) IMPLANT
SUTURE PLN GUT2-0 30 C14 SG823 (SUTURE) ×1 IMPLANT

## 2017-10-22 NOTE — Progress Notes (Signed)
Labor Progress Note  Kristin Pineda a 32 y.o.G3P0020 at 7259w0d by ultrasoundadmitted for induction of labor due tolate term dates  Subjective: Pt resting comfortably in bed on her right side with peanut ball between her legs. Comfortable with epidural.   Objective: BP 122/76 (BP Location: Right Arm)   Pulse 72   Temp 97.6 F (36.4 C) (Oral)   Resp 18   Ht 5' (1.524 m)   Wt 70.3 kg (155 lb)   SpO2 98%   BMI 30.27 kg/m   Fetal Assessment: FHT:  FHR: 135 bpm, variability: moderate,  accelerations:  Present,  decelerations:  Present variable Category/reactivity:  Category II UC:   regular, every 2-4 minutes SVE:    Dilation: 9cm  Effacement: 90%  Station:  -1  Consistency: soft  Position: anterior  Membrane status: AROM 10/21/17 1800 Amniotic color: clear  Assessment / Plan: Induction of labor due tolate term dates  Labor:Prolonged active stage, anterior lip remains with concern for cervical swelling. IUPC placed for accurate monitoring of contraction strength. Plan to give 50mg  IV diphenhydramine for cervical swelling.  Fetal Wellbeing:Category II for occasional variable decel, overall reassuring Pain Control:Epidural Anticipated ZOX:WRUEOD:NSVD vs LTCS  Plan of care discussed with Dr. Christeen DouglasBethany Beasley, who is in agreement with this plan.   Kristin Pineda, CNM 10/22/2017, 8:14 PM

## 2017-10-22 NOTE — Progress Notes (Signed)
Labor Progress Note  Kristin Pineda is a 32 y.o. G3P0020 at [redacted]w[redacted]d by ultrasound admitted for induction of labor due to late term.  Subjective:  Patient resting comfortably in bed, on her right side. Comfortable with epidural in place.   Objective: BP 122/68   Pulse 69   Temp 97.7 F (36.5 C) (Oral)   Resp 16   Ht 5' (1.524 m)   Wt 70.3 kg (155 lb)   SpO2 99%   BMI 30.27 kg/m   Fetal Assessment: FHT:  FHR: 135 bpm, variability: moderate,  accelerations:  Present,  decelerations:  Present early, variable Category/reactivity:  Category II UC:   regular, every 3 minutes SVE:   6cm/90%/-1 station per RN exam at 34 Membrane status: AROM 10/21/17 1800 Amniotic color: clear  Labs: Lab Results  Component Value Date   WBC 8.2 10/21/2017   HGB 13.2 10/21/2017   HCT 39.3 10/21/2017   MCV 104.7 (H) 10/21/2017   PLT 130 (L) 10/21/2017    Assessment / Plan: Induction of labor due to late term dates,  progressing normally on pitocin  Labor: Progressing normally, Pitocin currently infusing at 6220mu/min, titrate per protocol Fetal Wellbeing:  Category II, overall reassuring Pain Control:  Epidural Anticipated MOD:  NSVD  Kristin Pineda, CNM 10/22/2017, 12:08 PM

## 2017-10-22 NOTE — Progress Notes (Signed)
Labor Progress Note  Kristin Pineda is a 32 y.o. G3P0020 at 429w0d byultrasoundadmitted for induction of labor due tolate term.    Subjective: resting quietly with eyes closed, comfortable with epidural.   Objective: BP 122/68   Pulse 69   Temp (!) 97.4 F (36.3 C) (Oral)   Resp 18   Ht 5' (1.524 m)   Wt 155 lb (70.3 kg)   SpO2 99%   BMI 30.27 kg/m  Notable VS details: reviewed  Fetal Assessment: FHT:  FHR: 135 bpm, variability: moderate,  accelerations:  Present,  decelerations:  Present variable and early decels.  Category/reactivity:  Category II UC:   regular, every 3-5 minutes SVE:   Dilation: 5 Effacement (%): 80 Cervical Position: Middle Station: -1 Exam by:: Wesly Whisenant, CNM  Membrane status: ruptured, clear   Labs: Lab Results  Component Value Date   WBC 8.2 10/21/2017   HGB 13.2 10/21/2017   HCT 39.3 10/21/2017   MCV 104.7 (H) 10/21/2017   PLT 130 (L) 10/21/2017    Assessment / Plan: G3P0 at 279w0d; IOL for late term. Early labor, making change slowly. Continue to reposition with peanut ball.   Labor: Pitocin restarted, currently at 1112mu/min Fetal Wellbeing:  Category II Pain Control:  Epidural I/D:  n/a Anticipated MOD:  NSVD  Shia Delaine A, CNM 10/22/2017, 6:49 AM

## 2017-10-22 NOTE — Progress Notes (Signed)
Utilized stratus video interpreter to assess the patient and explain plan of care. Pt and significant other verbalized understanding.

## 2017-10-22 NOTE — Progress Notes (Signed)
Utilized Stratus video interpreter to explain medications given to patient pre op (see MAR) and expectations during c/s. Interpreter will be present during c/s. Pt and significant other verbalized understanding.

## 2017-10-22 NOTE — Anesthesia Post-op Follow-up Note (Signed)
Anesthesia QCDR form completed.        

## 2017-10-22 NOTE — Op Note (Signed)
  Cesarean Section Procedure Note  Date of procedure: 10/22/2017   Pre-operative Diagnosis: Intrauterine pregnancy at 7394w0d; induction of labor for late term; failure to progress and failure to descend; Cat I strip  Post-operative Diagnosis: same, delivered.  Procedure: Primary Low Transverse Cesarean Section through Pfannenstiel incision  Surgeon: Christeen DouglasBethany Caliope Ruppert, MD  Assistant(s):  Genia DelMargaret Haviland, CNM  Anesthesia: Epidural anesthesia  Anesthesiologist: Alver FisherPenwarden, Amy, MD Anesthesiologist: Yves Dillarroll, Paul, MD; Alver FisherPenwarden, Amy, MD CRNA: Mathews ArgyleLogan, Benjamin, CRNA  Estimated Blood Loss:  800mL         Drains: n/a         Total IV Fluids: 1100ml  Urine Output: 100ml         Specimens: none         Complications:  None; patient tolerated the procedure well.         Disposition: PACU - hemodynamically stable.         Condition: stable  Findings:  A female infant "Hector BrunswickJacob Levi" in cephalic OP presentation. Amniotic fluid - Clear  Birth weight 3610 g.  Apgars of 8 and 9 at one and five minutes respectively.  Intact placenta with a three-vessel cord.  Grossly normal uterus, tubes and ovaries bilaterally. No intraabdominal adhesions were noted.  Indications: cephalo-pelvic disproportion, failed induction, failure to progress: arrest of descent and failure to progress: arrest of dilation  Procedure Details  The patient was taken to Operating Room, identified as the correct patient and the procedure verified as C-Section Delivery. A formal Time Out was held with all team members present and in agreement.  After induction of anesthesia, the patient was draped and prepped in the usual sterile manner. A Pfannenstiel skin incision was made and carried down through the subcutaneous tissue to the fascia. Fascial incision was made and extended transversely with the Mayo scissors. The fascia was separated from the underlying rectus tissue superiorly and inferiorly. The peritoneum was identified  and entered bluntly. Peritoneal incision was extended longitudinally. The utero-vesical peritoneal reflection was incised transversely and a bladder flap was created digitally.   A low transverse hysterotomy was made. The fetus was delivered atraumatically. The umbilical cord was clamped x2 and cut and the infant was handed to the awaiting pediatricians. The placenta was removed intact and appeared normal, intact, and with a 3-vessel cord.   The uterus was exteriorized and cleared of all clot and debris. The hysterotomy was closed with running sutures of 0-Vicryl. A second imbricating layer was placed with the same suture. Excellent hemostasis was observed. The peritoneal cavity was cleared of all clots and debris. The uterus was returned to the abdomen. Interceed placed.  The pelvis was irrigated and again, excellent hemostasis was noted. The fascia was then reapproximated with running sutures of 0 Vicryl.  The subcutaneous tissue was reapproximated with running sutures of 0 Vicry. The skin was reapproximated with a 4-0 Monocryl subcuticular stitch.  20ml (in 30 of 0.5% bupivicaine and 50ml of NSS) of liposomal bupivicaine placed in the fascial and skin lines.  Instrument, sponge, and needle counts were correct prior to the abdominal closure and at the conclusion of the case.   The patient tolerated the procedure well and was transferred to the recovery room in stable condition.   Christeen DouglasBethany Shivank Pinedo, MD 10/22/2017

## 2017-10-22 NOTE — Progress Notes (Signed)
Labor Progress Note  Kristin Pineda is a 32 y.o. G3P0020 at 7180w0d by ultrasoundadmitted for induction of labor due tolate term.  Subjective: Called to bedside by RN for recurrent late decels, pt comfortable with epidural.   Objective: BP 122/72 (BP Location: Right Arm)   Pulse 61   Temp 98 F (36.7 C) (Oral)   Resp 18   Ht 5' (1.524 m)   Wt 155 lb (70.3 kg)   SpO2 98%   BMI 30.27 kg/m  Notable VS details: reviewed, afebrile.   Fetal Assessment: tracing reviewed for previous 2hrs, occasional late decel noted-non-recurrent,  moderate variability throughout, brief periods of tracing without accels. Overall reassuring. FSE applied for better continuous tracing.   FHT:  FHR: 130 bpm, variability: moderate,  accelerations:  Present,  decelerations:  Absent  Category/reactivity:  Category I UC:   regular, every 3-5 minutes  SVE:   Dilation: 4 Effacement (%): 80 Cervical Position: Middle Station: -1 Exam by:: Ailyn Gladd, cnm  Membrane status: Clear fluid, scant bloody show  Labs: Lab Results  Component Value Date   WBC 8.2 10/21/2017   HGB 13.2 10/21/2017   HCT 39.3 10/21/2017   MCV 104.7 (H) 10/21/2017   PLT 130 (L) 10/21/2017    Assessment / Plan: G3P0 at 5380w0d; IOL for late term. Early labor  Labor: early labor, restart Pitocin now.  Fetal Wellbeing:  Category I Pain Control:  Epidural I/D:  n/a Anticipated MOD:  NSVD  Janthony Holleman A, CNM 10/22/2017, 2:54 AM

## 2017-10-22 NOTE — Discharge Summary (Signed)
Obstetrical Discharge Summary  Patient Name: Kristin Pineda DOB: 1986/07/05 MRN: [id number]  Date of Admission: 10/20/2017 Date of Discharge: 10/25/2017  Primary OB:  Phineas Realharles Drew  Gestational Age at Delivery: 3029w0d   Antepartum complications: overweight Admitting Diagnosis: iol for late term Secondary Diagnosis: Patient Active Problem List   Diagnosis Date Noted  . Post-term pregnancy, 40-42 weeks of gestation 10/20/2017    Augmentation: AROM, Pitocin and Cytotec Complications: None Intrapartum complications/course:  Primary LTCS for failure to progress past 9cm Date of Delivery: 10/22/17 Delivered By: Christeen DouglasBethany Virgen Belland Delivery Type: primary cesarean section, low transverse incision Anesthesia: epidural Placenta: Extracted Laceration: No Episiotomy: none Newborn Data: Live born female "Hector BrunswickJacob Levi" Birth Weight: 7 lb 15.3 oz (3610 g) APGAR: 8, 9  Newborn Delivery   Birth date/time:  10/22/2017 23:16:00 Delivery type:  C-Section, Low Transverse C-section categorization:  Primary       Discharge Physical Exam:  BP 102/64 (BP Location: Left Arm)   Pulse 90   Temp 98.1 F (36.7 C) (Oral)   Resp 18   Ht 5' (1.524 m)   Wt 70.3 kg (155 lb)   SpO2 99%   Breastfeeding? Unknown   BMI 30.27 kg/m   General: NAD CV: RRR Pulm: CTABL, nl effort ABD: s/nd/nt, fundus firm and below the umbilicus Lochia: moderate Incision: c/d/i  DVT Evaluation: LE non-ttp, no evidence of DVT on exam.  Hemoglobin  Date Value Ref Range Status  10/23/2017 12.2 12.0 - 16.0 g/dL Final   HCT  Date Value Ref Range Status  10/23/2017 35.3 35.0 - 47.0 % Final    Post partum course: uncomplicated Postpartum Procedures: no Disposition: stable, discharge to home. Baby Feeding: breastmilk Baby Disposition: home with mom  Rh Immune globulin given: n/a  Contraception: undecided  Prenatal Labs:  Blood type/Rh A Pos  Antibody screen neg  Rubella Immune  Varicella Immune  RPR NR  HBsAg  Neg  HIV NR  GC neg  Chlamydia neg  1 hour GTT 124  GBS neg       Plan:  Kristin Ballma D Quilling was discharged to home in good condition. [facility]   Discharge Medications: Allergies as of 10/25/2017      Reactions   Penicillins Hives      Medication List    TAKE these medications   acetaminophen 325 MG tablet Commonly known as:  TYLENOL Take 325 mg by mouth every 6 (six) hours as needed for headache.   docusate sodium 100 MG capsule Commonly known as:  COLACE Take 1 capsule (100 mg total) by mouth 2 (two) times daily for 14 days. To keep stools soft, as needed   ibuprofen 800 MG tablet Commonly known as:  ADVIL,MOTRIN Take 1 tablet (800 mg total) by mouth every 8 (eight) hours as needed for moderate pain or cramping.   multivitamin-prenatal 27-0.8 MG Tabs tablet Take 1 tablet by mouth daily at 12 noon.   oxyCODONE-acetaminophen 5-325 MG tablet Commonly known as:  PERCOCET/ROXICET Take 1-2 tablets by mouth every 6 (six) hours as needed for severe pain.       Follow-up Information    Christeen DouglasBeasley, Lanijah Warzecha, MD In 2 weeks.   Specialty:  Obstetrics and Gynecology Why:  For postop check Contact information: [address] [place] [address] [phone]           Signed: Christeen DouglasBethany Nithin Demeo 10/25/17

## 2017-10-22 NOTE — Progress Notes (Signed)
31yo G3P0020 at 8671w0d after 2 days induction of labor for late term, now s/p cervical ripening, pitocin and AROM >24hrs. Arrest of dilation at 9 cm x 6 hours with Cat I strip throughout.   The risks of cesarean section discussed with the patient included but were not limited to: bleeding which may require transfusion or reoperation; infection which may require antibiotics; injury to bowel, bladder, ureters or other surrounding organs; injury to the fetus; need for additional procedures including hysterectomy in the event of a life-threatening hemorrhage; placental abnormalities wth subsequent pregnancies, incisional problems, thromboembolic phenomenon and other postoperative/anesthesia complications. The patient concurred with the proposed plan, giving informed written consent for the procedure.   Patient has been NPO since yesterday she will remain NPO for procedure. Anesthesia and OR aware. Preoperative prophylactic antibiotics and SCDs ordered on call to the OR.  To OR when anesthesia is ready.

## 2017-10-22 NOTE — Progress Notes (Signed)
Labor Progress Note  Kristin Pineda is a 32 y.o. G3P0020 at 7283w0d by ultrasound admitted for induction of labor due to late term dates.  Subjective: Patient getting ready to push. Reporting rectal and vaginal pressure with contractions. RN and partner at the bedside, video Spanish-language interpreter in use.   Objective: BP 126/76   Pulse 66   Temp 98.3 F (36.8 C) (Oral)   Resp 18   Ht 5' (1.524 m)   Wt 70.3 kg (155 lb)   SpO2 98%   BMI 30.27 kg/m   Fetal Assessment: FHT:  FHR: 135 bpm, variability: moderate,  accelerations:  Present,  decelerations:  Absent Category/reactivity:  Category I UC:   regular, every 2-3 minutes SVE:  Anterior lip/C/0 by Dr. Francena HanlyBeasley's exam Membrane status: AROM 10/21/17 1800 Amniotic color: clear  Labs: Lab Results  Component Value Date   WBC 8.2 10/21/2017   HGB 13.2 10/21/2017   HCT 39.3 10/21/2017   MCV 104.7 (H) 10/21/2017   PLT 130 (L) 10/21/2017    Assessment / Plan: Induction of labor due to late term dates, progressing normally on pitocin  Labor: Progressing normally, Pitocin currently infusing at 4620mu/min, plan to labor down for anterior lip Fetal Wellbeing:  Category I1 Pain Control:  Epidural Anticipated MOD:  NSVD   Kristin Pineda, CNM 10/22/2017, 5:55 PM

## 2017-10-22 NOTE — Progress Notes (Signed)
Dr. Dalbert GarnetBeasley and Grafton FolkM Haviland, CNM and this nurse at bedside utilizing [facility] video interpreter to communicate need for cesarean section to patient. Consent obtained. Pt and significant other verbalized understanding.

## 2017-10-22 NOTE — Progress Notes (Signed)
Labor Progress Note  Kristin Pineda is a 32 y.o. G3P0020 at 8461w0d by ultrasound admitted for induction of labor due to lat term.  Subjective: Resting in bed, comfortable with epidural, partner at bedside.   Objective: BP 122/68   Pulse 69   Temp 97.7 F (36.5 C) (Oral)   Resp 16   Ht 5' (1.524 m)   Wt 70.3 kg (155 lb)   SpO2 99%   BMI 30.27 kg/m   Fetal Assessment: FHT:  FHR: 135 bpm, variability: moderate,  accelerations:  Present,  decelerations:  Present variable and early Category/reactivity:  Category II UC:   regular, every 2-4 minutes SVE:  5cm/80%/-1 per exam at 0640 Membrane status: AROM 10/21/17 1800  Amniotic color: clear  Labs: Lab Results  Component Value Date   WBC 8.2 10/21/2017   HGB 13.2 10/21/2017   HCT 39.3 10/21/2017   MCV 104.7 (H) 10/21/2017   PLT 130 (L) 10/21/2017    Assessment / Plan: Induction of labor due to late term,  progressing well on pitocin  Labor: Progressing normally Fetal Wellbeing:  Category II Pain Control:  Epidural Anticipated MOD:  NSVD  Genia DelMargaret Zael Shuman, CNM 10/22/2017, 9:21 AM

## 2017-10-23 ENCOUNTER — Encounter: Payer: Self-pay | Admitting: *Deleted

## 2017-10-23 ENCOUNTER — Other Ambulatory Visit: Payer: Self-pay

## 2017-10-23 LAB — CBC
HCT: 35.3 % (ref 35.0–47.0)
HEMOGLOBIN: 12.2 g/dL (ref 12.0–16.0)
MCH: 36.2 pg — ABNORMAL HIGH (ref 26.0–34.0)
MCHC: 34.5 g/dL (ref 32.0–36.0)
MCV: 105 fL — ABNORMAL HIGH (ref 80.0–100.0)
Platelets: 107 10*3/uL — ABNORMAL LOW (ref 150–440)
RBC: 3.36 MIL/uL — ABNORMAL LOW (ref 3.80–5.20)
RDW: 13.9 % (ref 11.5–14.5)
WBC: 13 10*3/uL — AB (ref 3.6–11.0)

## 2017-10-23 MED ORDER — ACETAMINOPHEN 325 MG PO TABS
ORAL_TABLET | ORAL | Status: AC
  Administered 2017-10-23: 650 mg via ORAL
  Filled 2017-10-23: qty 2

## 2017-10-23 MED ORDER — KETOROLAC TROMETHAMINE 30 MG/ML IJ SOLN
INTRAMUSCULAR | Status: AC
  Administered 2017-10-23: 30 mg via INTRAVENOUS
  Filled 2017-10-23: qty 1

## 2017-10-23 MED ORDER — DIBUCAINE 1 % RE OINT: 1.0000 "application " | TOPICAL_OINTMENT | RECTAL | Status: DC | PRN

## 2017-10-23 MED ORDER — KETOROLAC TROMETHAMINE 30 MG/ML IJ SOLN
30.0000 mg | Freq: Four times a day (QID) | INTRAMUSCULAR | Status: DC
  Administered 2017-10-23 (×3): 30 mg via INTRAVENOUS
  Filled 2017-10-23 (×2): qty 1

## 2017-10-23 MED ORDER — BISACODYL 10 MG RE SUPP: 10.0000 mg | Freq: Every day | RECTAL | Status: DC | PRN

## 2017-10-23 MED ORDER — DIPHENHYDRAMINE HCL 25 MG PO CAPS: 25.0000 mg | ORAL_CAPSULE | Freq: Four times a day (QID) | ORAL | Status: DC | PRN

## 2017-10-23 MED ORDER — PRENATAL MULTIVITAMIN CH
1.0000 | ORAL_TABLET | Freq: Every day | ORAL | Status: DC
  Administered 2017-10-23 – 2017-10-25 (×3): 1 via ORAL
  Filled 2017-10-23 (×3): qty 1

## 2017-10-23 MED ORDER — OXYCODONE HCL 5 MG PO TABS
5.0000 mg | ORAL_TABLET | ORAL | Status: DC | PRN
  Administered 2017-10-24: 5 mg via ORAL
  Filled 2017-10-23: qty 1

## 2017-10-23 MED ORDER — MORPHINE SULFATE (PF) 0.5 MG/ML IJ SOLN
INTRAMUSCULAR | Status: AC
  Filled 2017-10-23: qty 10

## 2017-10-23 MED ORDER — ONDANSETRON HCL 4 MG/2ML IJ SOLN
INTRAMUSCULAR | Status: AC
  Filled 2017-10-23: qty 2

## 2017-10-23 MED ORDER — TETANUS-DIPHTH-ACELL PERTUSSIS 5-2.5-18.5 LF-MCG/0.5 IM SUSP: 0.5000 mL | Freq: Once | INTRAMUSCULAR | Status: DC

## 2017-10-23 MED ORDER — PHENYLEPHRINE HCL 10 MG/ML IJ SOLN
INTRAMUSCULAR | Status: AC
  Filled 2017-10-23: qty 1

## 2017-10-23 MED ORDER — PROPOFOL 10 MG/ML IV BOLUS
INTRAVENOUS | Status: AC
  Filled 2017-10-23: qty 20

## 2017-10-23 MED ORDER — ACETAMINOPHEN 325 MG PO TABS
650.0000 mg | ORAL_TABLET | ORAL | Status: DC | PRN
  Administered 2017-10-25 (×2): 650 mg via ORAL
  Filled 2017-10-23 (×2): qty 2

## 2017-10-23 MED ORDER — FLEET ENEMA 7-19 GM/118ML RE ENEM: 1.0000 | ENEMA | Freq: Every day | RECTAL | Status: DC | PRN

## 2017-10-23 MED ORDER — NALBUPHINE HCL 10 MG/ML IJ SOLN: 5.0000 mg | INTRAMUSCULAR | Status: DC | PRN

## 2017-10-23 MED ORDER — OXYTOCIN 40 UNITS IN LACTATED RINGERS INFUSION - SIMPLE MED
2.5000 [IU]/h | INTRAVENOUS | Status: AC
  Filled 2017-10-23: qty 1000

## 2017-10-23 MED ORDER — OXYCODONE-ACETAMINOPHEN 5-325 MG PO TABS: 1.0000 | ORAL_TABLET | ORAL | Status: DC | PRN

## 2017-10-23 MED ORDER — LACTATED RINGERS IV SOLN
INTRAVENOUS | Status: DC
  Administered 2017-10-23: 05:00:00 via INTRAVENOUS

## 2017-10-23 MED ORDER — NALOXONE HCL 0.4 MG/ML IJ SOLN: 0.4000 mg | INTRAMUSCULAR | Status: DC | PRN

## 2017-10-23 MED ORDER — NALBUPHINE HCL 10 MG/ML IJ SOLN: 5.0000 mg | Freq: Once | INTRAMUSCULAR | Status: DC | PRN

## 2017-10-23 MED ORDER — MENTHOL 3 MG MT LOZG
1.0000 | LOZENGE | OROMUCOSAL | Status: DC | PRN
Start: 2017-10-23 — End: 2017-10-25
  Filled 2017-10-23: qty 9

## 2017-10-23 MED ORDER — DIPHENHYDRAMINE HCL 25 MG PO CAPS: 25.0000 mg | ORAL_CAPSULE | ORAL | Status: DC | PRN

## 2017-10-23 MED ORDER — OXYCODONE HCL 5 MG PO TABS: 10.0000 mg | ORAL_TABLET | ORAL | Status: DC | PRN

## 2017-10-23 MED ORDER — SODIUM CHLORIDE 0.9% FLUSH: 3.0000 mL | INTRAVENOUS | Status: DC | PRN

## 2017-10-23 MED ORDER — MEASLES, MUMPS & RUBELLA VAC ~~LOC~~ INJ
0.5000 mL | INJECTION | Freq: Once | SUBCUTANEOUS | Status: DC
  Filled 2017-10-23: qty 0.5

## 2017-10-23 MED ORDER — SIMETHICONE 80 MG PO CHEW
80.0000 mg | CHEWABLE_TABLET | ORAL | Status: DC
  Filled 2017-10-23: qty 1

## 2017-10-23 MED ORDER — OXYCODONE-ACETAMINOPHEN 5-325 MG PO TABS: 2.0000 | ORAL_TABLET | ORAL | Status: DC | PRN

## 2017-10-23 MED ORDER — IBUPROFEN 600 MG PO TABS
600.0000 mg | ORAL_TABLET | Freq: Four times a day (QID) | ORAL | Status: DC
  Administered 2017-10-23 – 2017-10-25 (×8): 600 mg via ORAL
  Filled 2017-10-23 (×8): qty 1

## 2017-10-23 MED ORDER — WITCH HAZEL-GLYCERIN EX PADS: 1.0000 "application " | MEDICATED_PAD | CUTANEOUS | Status: DC | PRN

## 2017-10-23 MED ORDER — COCONUT OIL OIL: 1.0000 "application " | TOPICAL_OIL | Status: DC | PRN

## 2017-10-23 MED ORDER — ACETAMINOPHEN 325 MG PO TABS
650.0000 mg | ORAL_TABLET | Freq: Four times a day (QID) | ORAL | Status: AC
  Administered 2017-10-23 (×4): 650 mg via ORAL
  Filled 2017-10-23 (×3): qty 2

## 2017-10-23 MED ORDER — SIMETHICONE 80 MG PO CHEW: 80.0000 mg | CHEWABLE_TABLET | ORAL | Status: DC | PRN

## 2017-10-23 MED ORDER — KETOROLAC TROMETHAMINE 30 MG/ML IJ SOLN: 30.0000 mg | Freq: Four times a day (QID) | INTRAMUSCULAR | Status: DC

## 2017-10-23 MED ORDER — ONDANSETRON HCL 4 MG/2ML IJ SOLN: 4.0000 mg | Freq: Three times a day (TID) | INTRAMUSCULAR | Status: DC | PRN

## 2017-10-23 MED ORDER — DIPHENHYDRAMINE HCL 50 MG/ML IJ SOLN: 12.5000 mg | INTRAMUSCULAR | Status: DC | PRN

## 2017-10-23 MED ORDER — EPHEDRINE SULFATE 50 MG/ML IJ SOLN
INTRAMUSCULAR | Status: AC
  Filled 2017-10-23: qty 1

## 2017-10-23 MED ORDER — MEPERIDINE HCL 25 MG/ML IJ SOLN: 6.2500 mg | INTRAMUSCULAR | Status: DC | PRN

## 2017-10-23 MED ORDER — SIMETHICONE 80 MG PO CHEW
80.0000 mg | CHEWABLE_TABLET | Freq: Three times a day (TID) | ORAL | Status: DC
  Administered 2017-10-23 – 2017-10-25 (×8): 80 mg via ORAL
  Filled 2017-10-23 (×8): qty 1

## 2017-10-23 MED ORDER — MIDAZOLAM HCL 2 MG/2ML IJ SOLN
INTRAMUSCULAR | Status: AC
Start: 2017-10-23 — End: 2017-10-23
  Filled 2017-10-23: qty 2

## 2017-10-23 MED ORDER — IBUPROFEN 600 MG PO TABS: 600.0000 mg | ORAL_TABLET | Freq: Four times a day (QID) | ORAL | Status: DC

## 2017-10-23 MED ORDER — SENNOSIDES-DOCUSATE SODIUM 8.6-50 MG PO TABS
2.0000 | ORAL_TABLET | ORAL | Status: DC
  Administered 2017-10-23 – 2017-10-25 (×3): 2 via ORAL
  Filled 2017-10-23 (×3): qty 2

## 2017-10-23 MED ORDER — FENTANYL CITRATE (PF) 100 MCG/2ML IJ SOLN
INTRAMUSCULAR | Status: AC
  Filled 2017-10-23: qty 2

## 2017-10-23 NOTE — Progress Notes (Signed)
Subjective: Postpartum Day 1: Cesarean Delivery Patient reports some leg heaviness  Objective: Vital signs in last 24 hours: Temp:  [97.6 F (36.4 C)-100 F (37.8 C)] 99 F (37.2 C) (02/15 0446) Pulse Rate:  [65-108] 87 (02/15 0309) Resp:  [0-19] 18 (02/15 0309) BP: (86-132)/(59-82) 114/65 (02/15 0309) SpO2:  [95 %-100 %] 95 % (02/15 0309)  Physical Exam:  General: alert and cooperative Lochia: appropriate Uterine Fundus: firm Incision: no significant drainage DVT Evaluation: No evidence of DVT seen on physical exam.  Recent Labs    10/21/17 0008 10/23/17 0647  HGB 13.2 12.2  HCT 39.3 35.3    Assessment/Plan: Status post Cesarean section. Doing well postoperatively.  Continue current care. D/c foley  D/c ivf Ihor Austinhomas J Ahliya Glatt 10/23/2017, 7:31 AM

## 2017-10-23 NOTE — Anesthesia Postprocedure Evaluation (Signed)
Anesthesia Post Note  Patient: Kristin Pineda  Procedure(s) Performed: CESAREAN SECTION (N/A Abdomen)  Patient location during evaluation: Mother Baby Anesthesia Type: Epidural Level of consciousness: awake, awake and alert and oriented Pain management: pain level controlled Vital Signs Assessment: post-procedure vital signs reviewed and stable Respiratory status: spontaneous breathing, nonlabored ventilation and respiratory function stable Cardiovascular status: blood pressure returned to baseline and stable Postop Assessment: no headache and no backache Anesthetic complications: no     Last Vitals:  Vitals:   10/23/17 0309 10/23/17 0446  BP: 114/65   Pulse: 87   Resp: 18   Temp: 37.1 C 37.2 C  SpO2: 95%     Last Pain:  Vitals:   10/23/17 0615  TempSrc:   PainSc: 8                  [employer] CorporationStephanie Sharifa Bucholz

## 2017-10-23 NOTE — Anesthesia Post-op Follow-up Note (Signed)
  Anesthesia Pain Follow-up Note  Patient: Kristin Pineda  Day #: 1  Date of Follow-up: 10/23/2017 Time: 7:28 AM  Last Vitals:  Vitals:   10/23/17 0309 10/23/17 0446  BP: 114/65   Pulse: 87   Resp: 18   Temp: 37.1 C 37.2 C  SpO2: 95%     Level of Consciousness: alert  Pain: mild   Side Effects:None  Catheter Site Exam:clean, dry, no drainage     Plan: D/C from anesthesia care at surgeon's request  [facility] Maeghan Canny

## 2017-10-23 NOTE — Discharge Instructions (Signed)

## 2017-10-23 NOTE — Anesthesia Postprocedure Evaluation (Signed)
Anesthesia Post Note  Patient: Kristin Pineda  Procedure(s) Performed: CESAREAN SECTION (N/A Abdomen)  Patient location during evaluation: Mother Baby Anesthesia Type: Epidural Level of consciousness: awake, awake and alert and oriented Pain management: pain level controlled Vital Signs Assessment: post-procedure vital signs reviewed and stable Respiratory status: spontaneous breathing, nonlabored ventilation and respiratory function stable Cardiovascular status: blood pressure returned to baseline and stable Postop Assessment: no headache, no backache, patient able to bend at knees and no apparent nausea or vomiting Anesthetic complications: no     Last Vitals:  Vitals:   10/23/17 0309 10/23/17 0446  BP: 114/65   Pulse: 87   Resp: 18   Temp: 37.1 C 37.2 C  SpO2: 95%     Last Pain:  Vitals:   10/23/17 0615  TempSrc:   PainSc: 8                  [employer] CorporationStephanie Ileene Allie

## 2017-10-23 NOTE — Transfer of Care (Signed)
Immediate Anesthesia Transfer of Care Note  Patient: Kristin Pineda  Procedure(s) Performed: CESAREAN SECTION (N/A Abdomen)  Patient Location: L&D  Anesthesia Type:Epidural  Level of Consciousness: awake, alert  and oriented  Airway & Oxygen Therapy: Patient Spontanous Breathing  Post-op Assessment: Report given to RN and Post -op Vital signs reviewed and stable  Post vital signs: Reviewed and stable  Last Vitals:  Vitals:   10/22/17 1919 10/22/17 2221  BP: 122/76 119/64  Pulse: 72 79  Resp: 18 18  Temp: 36.4 C 36.7 C  SpO2:      Last Pain:  Vitals:   10/22/17 2221  TempSrc: Oral  PainSc: 0-No pain         Complications: No apparent anesthesia complications

## 2017-10-24 MED ORDER — BACITRACIN-NEOMYCIN-POLYMYXIN 400-5-5000 EX OINT
TOPICAL_OINTMENT | CUTANEOUS | Status: DC | PRN
  Administered 2017-10-24: 1 via TOPICAL
  Filled 2017-10-24 (×3): qty 1

## 2017-10-24 NOTE — Progress Notes (Signed)
  Subjective:   Doing well.  No complaints.  Voiding, ambulating, tolerating regular PO diet, tolerating pain with PO meds. Denies: CP SOB F/C, N/V, calf pain    Objective:  Blood pressure (!) 99/58, pulse 78, temperature 98 F (36.7 C), temperature source Oral, resp. rate 18, height 5' (1.524 m), weight 70.3 kg (155 lb), SpO2 98 %,  General: NAD Pulmonary: no increased work of breathing Abdomen: non-distended, non-tender, fundus firm at level of umbilicus Incision: c/d/i Extremities: no edema, no erythema, no tenderness    Assessment:   32 y.o. [id number] postoperativeday # 2   Plan:   1. Ambulating well, no issues, continue to encourage ambulation 2. Routine postop cares 3. Baby needing bili-lights, encourage feeding 4. Continue inpatient care  ----- Ranae Plumberhelsea Ward, MD Attending Obstetrician and Gynecologist [facility], Department of OB[facility]GYN [facility]

## 2017-10-24 NOTE — Progress Notes (Signed)
Video Interpreter used to Educate Pt. On POC and medications as well as teaching. V/O. Denies questions and/or needs. Dub AmisInterpreter, Martin # [phone].

## 2017-10-25 MED ORDER — DOCUSATE SODIUM 100 MG PO CAPS: 100.0000 mg | ORAL_CAPSULE | Freq: Two times a day (BID) | ORAL | 0 refills | Status: AC

## 2017-10-25 MED ORDER — OXYCODONE-ACETAMINOPHEN 5-325 MG PO TABS: 1.0000 | ORAL_TABLET | Freq: Four times a day (QID) | ORAL | 0 refills | Status: DC | PRN

## 2017-10-25 MED ORDER — IBUPROFEN 800 MG PO TABS: 800.0000 mg | ORAL_TABLET | Freq: Three times a day (TID) | ORAL | 1 refills | Status: DC | PRN

## 2017-10-25 NOTE — Progress Notes (Signed)
Patient discharged home with infant. Discharge instructions, prescriptions and follow up appointment given to and reviewed with patient. Patient verbalized understanding. Patient wheeled out with infant by RN 

## 2021-10-18 ENCOUNTER — Other Ambulatory Visit: Payer: Self-pay | Admitting: Primary Care

## 2021-10-18 LAB — OB RESULTS CONSOLE GC/CHLAMYDIA
Chlamydia: NEGATIVE
Neisseria Gonorrhea: NEGATIVE

## 2021-10-18 LAB — OB RESULTS CONSOLE HEPATITIS B SURFACE ANTIGEN: Hepatitis B Surface Ag: NEGATIVE

## 2021-10-18 LAB — OB RESULTS CONSOLE HIV ANTIBODY (ROUTINE TESTING): HIV: NONREACTIVE

## 2021-10-18 LAB — OB RESULTS CONSOLE RPR: RPR: NONREACTIVE

## 2021-10-18 LAB — OB RESULTS CONSOLE VARICELLA ZOSTER ANTIBODY, IGG: Varicella: IMMUNE

## 2021-10-18 LAB — OB RESULTS CONSOLE RUBELLA ANTIBODY, IGM: Rubella: IMMUNE

## 2021-11-18 ENCOUNTER — Other Ambulatory Visit: Payer: Self-pay | Admitting: Primary Care

## 2021-11-18 DIAGNOSIS — Z3689 Encounter for other specified antenatal screening: Secondary | ICD-10-CM

## 2021-12-05 ENCOUNTER — Ambulatory Visit (HOSPITAL_COMMUNITY): Payer: BLUE CROSS/BLUE SHIELD

## 2021-12-12 ENCOUNTER — Other Ambulatory Visit: Payer: Self-pay

## 2021-12-12 DIAGNOSIS — O09522 Supervision of elderly multigravida, second trimester: Secondary | ICD-10-CM

## 2021-12-17 ENCOUNTER — Ambulatory Visit: Payer: BC Managed Care – PPO | Attending: Obstetrics and Gynecology

## 2021-12-17 ENCOUNTER — Ambulatory Visit (HOSPITAL_BASED_OUTPATIENT_CLINIC_OR_DEPARTMENT_OTHER): Payer: BC Managed Care – PPO

## 2021-12-17 ENCOUNTER — Other Ambulatory Visit: Payer: Self-pay | Admitting: Primary Care

## 2021-12-17 DIAGNOSIS — Z3689 Encounter for other specified antenatal screening: Secondary | ICD-10-CM

## 2021-12-17 DIAGNOSIS — O09522 Supervision of elderly multigravida, second trimester: Secondary | ICD-10-CM | POA: Insufficient documentation

## 2021-12-17 DIAGNOSIS — Z363 Encounter for antenatal screening for malformations: Secondary | ICD-10-CM | POA: Diagnosis not present

## 2021-12-17 DIAGNOSIS — Z3A20 20 weeks gestation of pregnancy: Secondary | ICD-10-CM | POA: Insufficient documentation

## 2021-12-17 DIAGNOSIS — Z818 Family history of other mental and behavioral disorders: Secondary | ICD-10-CM

## 2021-12-17 DIAGNOSIS — Z8279 Family history of other congenital malformations, deformations and chromosomal abnormalities: Secondary | ICD-10-CM

## 2021-12-17 NOTE — Progress Notes (Signed)
Referring Provider:  Center, EthelLength of Consultation: 30 minutes ? ?Kristin Pineda was referred to Maternal Fetal Care at [facility] for genetic counseling because of advanced maternal age.  The patient will be 37 years old at the time of delivery.  This note summarizes the information we discussed.  The patient was seen at this visit with her partner.  A Spanish interpreter was utilized for this visit. ? ?We explained that the chance of a chromosome abnormality increases with maternal age.  Chromosomes and examples of chromosome problems were reviewed.  Humans typically have 46 chromosomes in each cell, with half passed through each sperm and egg.  Any change in the number or structure of chromosomes can increase the risk of problems in the physical and mental development of a pregnancy.  ? ?Based upon age of the patient, the chance of any chromosome abnormality was 1 in 3. The chance of Down syndrome, the most common chromosome problem associated with maternal age, was 1 in 43.  The risk of chromosome problems is in addition to the 3% general population risk for birth defects and intellectual disabilities.  The greatest chance, of course, is that the baby would be born in good health. ? ?We discussed the following prenatal screening and testing options for this pregnancy: ? ?Cell free fetal DNA testing analyzes maternal blood to determine whether or not the baby may have Down syndrome, trisomy 63, or trisomy 80.  This test utilizes a maternal blood sample and DNA sequencing technology to isolate circulating cell free fetal DNA from maternal plasma.  The fetal DNA can then be analyzed for DNA sequences that are derived from the three most common chromosomes involved in aneuploidy, chromosomes 13, 18, and 21.  If the overall amount of DNA is greater than the expected level for any of these chromosomes, aneuploidy is suspected.  While we do not consider it a replacement for invasive testing and  karyotype analysis, a negative result from this testing would be reassuring, though not a guarantee of a normal chromosome complement for the baby.  An abnormal result is certainly suggestive of an abnormal chromosome complement, though we would still recommend CVS or amniocentesis to confirm any findings from this testing. ? ?Targeted ultrasound uses high frequency sound waves to create an image of the developing fetus.  An ultrasound is often recommended as a routine means of evaluating the pregnancy.  It is also used to screen for fetal anatomy problems (for example, a heart defect) that might be suggestive of a chromosomal or other abnormality.  ? ?Amniocentesis involves the removal of a small amount of amniotic fluid from the sac surrounding the fetus with the use of a thin needle inserted through the maternal abdomen and uterus.  Ultrasound guidance is used throughout the procedure.  Fetal cells from amniotic fluid are directly evaluated and > 99.5% of chromosome problems and > 98% of open neural tube defects can be detected. This procedure is generally performed after the 15th week of pregnancy.  The main risks to this procedure include complications leading to miscarriage in less than 1 in 200 cases (0.5%). ? ?Cystic Fibrosis and Spinal Muscular Atrophy (SMA) screening were also discussed with the patient. Both conditions are recessive, which means that both parents must be carriers in order to have a child with the disease.  Cystic fibrosis (CF) is one of the most common genetic conditions in persons of Caucasian ancestry.  This condition occurs in approximately 1 in 2,500  Caucasian persons and results in thickened secretions in the lungs, digestive, and reproductive systems.  For a baby to be at risk for having CF, both of the parents must be carriers for this condition.  Approximately 1 in 73 Caucasian persons is a carrier for CF.  Current carrier testing looks for the most common mutations in the gene  for CF and can detect approximately 90% of carriers in the Caucasian population.  This means that the carrier screening can greatly reduce, but cannot eliminate, the chance for an individual to have a child with CF.  If an individual is found to be a carrier for CF, then carrier testing would be available for the partner. As part of Kristin Pineda newborn screening profile, all babies born in the state of [place] will have a two-tier screening process.  Specimens are first tested to determine the concentration of immunoreactive trypsinogen (IRT).  The top 5% of specimens with the highest IRT values then undergo DNA testing using a panel of over 40 common CF mutations. SMA is a neurodegenerative disorder that leads to atrophy of skeletal muscle and overall weakness.  This condition is also more prevalent in the Caucasian population, with 1 in 40-1 in 60 persons being a carrier and 1 in 6,000-1 in 10,000 children being affected.  There are multiple forms of the disease, with some causing death in infancy to other forms with survival into adulthood.  The genetics of SMA is complex, but carrier screening can detect up to 95% of carriers in the Caucasian population.  Similar to CF, a negative result can greatly reduce, but cannot eliminate, the chance to have a child with SMA.  Hemoglobinopathy screening was also offered to the patient. ? ?We obtained a detailed family history and pregnancy history.  The father of the baby reported one nephew (his sister's son) with autism. Autism Spectrum Disorder affects approximately 1-2% of the general population in the [place], [place], and [place]. Autism is a neurological and developmental disorder that affects how people interact with others, communicate, learn, and behave. Autism is known as a "spectrum" disorder because there is wide variation in the type and severity of symptoms people experience. Genetic testing for individuals with a clinical diagnosis of autism  yields an explanation in only about 20% of cases, and the remaining 80% of cases are left with unknown etiology. In the absence of a known genetic cause in the affected family member, we are unable to test directly for autism in pregnancy. We did review the option of carrier testing for Fragile X syndrome, the most common inherited cause for intellectual delays which can have autism as a feature. Because this is on the father of the baby's side and he has not experienced developmental differences or autism spectrum disorder, it is unlikely that he has a genetic change for Fragile X that would affect this pregnancy. He also reported another nephew who has a son with Down syndrome. Down syndrome is caused by an extra copy of the genetic instructions located on chromosome number 21.  These extra instructions result in the characteristic facial appearance, intellectual disabilities and an increased risk for some types of birth defects.  The majority (95%) of persons with Down syndrome have three freestanding copies of chromosome number 21, called trisomy 23.  This type of Down syndrome occurs as a sporadic condition and does not increase the chance for other family members to have Down syndrome.  Rarely, Down syndrome is caused by a rearrangement  of the genetic instructions, where the extra chromosome number 21 is attached to another chromosome.  This type of chromosome rearrangement can be passed down through families and therefore may increase the chance for Down syndrome in other family members.  We cannot determine which type of Down syndrome is present without documentation of chromosome studies in the affected family member.  If any additional information is obtained about this history, please do not hesitate to contact us. The remainder of the family history is unremarkable for birth defects, developmental delays, recurrent pregnancy loss or known chromosome abnormalities. ? ?Ms. Favia reported that this is her  third pregnancy.  The couple has a healthy 80 year old son and had one first trimester miscarriage.  She reported no complications or exposures in this pregnancy to medications, tobacco, alcohol or recrea

## 2022-03-13 ENCOUNTER — Ambulatory Visit: Payer: BC Managed Care – PPO

## 2022-03-31 ENCOUNTER — Other Ambulatory Visit: Payer: Self-pay

## 2022-03-31 DIAGNOSIS — O09522 Supervision of elderly multigravida, second trimester: Secondary | ICD-10-CM

## 2022-03-31 DIAGNOSIS — Z98891 History of uterine scar from previous surgery: Secondary | ICD-10-CM

## 2022-04-01 ENCOUNTER — Ambulatory Visit: Payer: BC Managed Care – PPO | Attending: Obstetrics and Gynecology

## 2022-04-01 ENCOUNTER — Other Ambulatory Visit: Payer: Self-pay

## 2022-04-01 DIAGNOSIS — O09522 Supervision of elderly multigravida, second trimester: Secondary | ICD-10-CM

## 2022-04-01 DIAGNOSIS — Z98891 History of uterine scar from previous surgery: Secondary | ICD-10-CM

## 2022-04-01 DIAGNOSIS — Z3A35 35 weeks gestation of pregnancy: Secondary | ICD-10-CM | POA: Diagnosis not present

## 2022-04-01 DIAGNOSIS — O09523 Supervision of elderly multigravida, third trimester: Secondary | ICD-10-CM

## 2022-04-01 DIAGNOSIS — O26893 Other specified pregnancy related conditions, third trimester: Secondary | ICD-10-CM | POA: Diagnosis not present

## 2022-04-07 NOTE — H&P (Signed)
Kristin Pineda is a 36 y.o. female presenting for repeat LTCS . Scheduled for 04/25/22. EDC 05/02/22 based on LMP .  OB History     Gravida  3   Para  1   Term  1   Preterm      AB  1   Living  1      SAB  1   IAB      Ectopic      Multiple  0   Live Births  1          Past Medical History:  Diagnosis Date   Medical history non-contributory    Past Surgical History:  Procedure Laterality Date   CESAREAN SECTION N/A 10/22/2017   Procedure: CESAREAN SECTION;  Surgeon: Kristin Douglas, MD;  Location: [facility] ORS;  Service: Obstetrics;  Laterality: N/A;   Family History: family history includes Diabetes in her mother; Hypertension in her mother. Social History:  reports that she has never smoked. She has never used smokeless tobacco. She reports that she does not drink alcohol and does not use drugs.     Maternal Diabetes: No Genetic Screening: Declined Maternal Ultrasounds/Referrals: Normal Fetal Ultrasounds or other Referrals:  None Maternal Substance Abuse:  No Significant Maternal Medications:  None Significant Maternal Lab Results:  Other:  Number of Prenatal Visits:greater than 3 verified prenatal visits Other Comments:  None  Review of Systems History   Last menstrual period 07/26/2021, unknown if currently breastfeeding. Exam Physical Exam  Bp 120/69 Lungs CTA   Cv RRR  Abd gravid  Prenatal labs: ABO, Rh:  A+ Antibody:  Neg Rubella:  Imm , Vz Imm RPR:   NR HBsAg:   neg HIV:   Neg GBS:   not done   Assessment/Plan: Elective repeat LTCS  on 8/18/2 at 39+0 weeks     Kristin Pineda 04/07/2022, 4:51 PM

## 2022-04-08 LAB — OB RESULTS CONSOLE GBS: GBS: NEGATIVE

## 2022-04-16 ENCOUNTER — Encounter
Admission: RE | Admit: 2022-04-16 | Discharge: 2022-04-16 | Disposition: A | Discharge status: Home / Self Care | Payer: BC Managed Care – PPO | Source: Ambulatory Visit | Attending: Obstetrics and Gynecology | Admitting: Obstetrics and Gynecology

## 2022-04-16 NOTE — Patient Instructions (Addendum)
Your procedure is scheduled on: Friday, August 18 Arrival Time: Please call Labor and Delivery the day before your scheduled C-Section to find out your arrival time. [phone].  Arrival: If your arrival time is prior to 6:00 am, please enter through the Emergency Room Entrance and you will be directed to Labor and Delivery. If your arrival time is 6:00 am or later, please enter the Medical Mall and follow the greeter's instructions.  REMEMBER: Instructions that are not followed completely may result in serious medical risk, up to and including death; or upon the discretion of your surgeon and anesthesiologist your surgery may need to be rescheduled.  Do not eat food after midnight the night before surgery.  No gum chewing, lozengers or hard candies.  You may however, drink CLEAR liquids up to 2 hours before you are scheduled to arrive for your surgery. Do not drink anything within 2 hours of your scheduled arrival time.  Clear liquids include: - water  - apple juice without pulp - gatorade (not RED colors) - black coffee or tea (Do NOT add milk or creamers to the coffee or tea) Do NOT drink anything that is not on this list.  DO NOT TAKE ANY MEDICATIONS THE MORNING OF SURGERY  One week prior to surgery: starting August 11 Stop Anti-inflammatories (NSAIDS) such as Advil, Aleve, Ibuprofen, Motrin, Naproxen, Naprosyn and Aspirin based products such as Excedrin, Goodys Powder, BC Powder. Stop ANY OVER THE COUNTER supplements until after surgery. You may however, continue to take Tylenol if needed for pain up until the day of surgery.  No Alcohol for 24 hours before or after surgery.  No Smoking including e-cigarettes for 24 hours prior to surgery.  No chewable tobacco products for at least 6 hours prior to surgery.  No nicotine patches on the day of surgery.  Do not use any "recreational" drugs for at least a week prior to your surgery.  Please be advised that the combination of  cocaine and anesthesia may have negative outcomes, up to and including death. If you test positive for cocaine, your surgery will be cancelled.  On the morning of surgery brush your teeth with toothpaste and water, you may rinse your mouth with mouthwash if you wish. Do not swallow any toothpaste or mouthwash.  Use CHG wipes as directed on instruction sheet.  Do not wear jewelry, make-up, hairpins, clips or nail polish.  Do not wear lotions, powders, or perfumes.   Do not shave body from the neck down 48 hours prior to surgery just in case you cut yourself which could leave a site for infection.  Also, freshly shaved skin may become irritated if using the CHG soap.  Contact lenses, hearing aids and dentures may not be worn into surgery.  Do not bring valuables to the hospital. [facility] is not responsible for any missing/lost belongings or valuables.   Notify your doctor if there is any change in your medical condition (cold, fever, infection).  Wear comfortable clothing (specific to your surgery type) to the hospital.  After surgery, you can help prevent lung complications by doing breathing exercises.  Take deep breaths and cough every 1-2 hours. Your doctor may order a device called an Incentive Spirometer to help you take deep breaths. When coughing or sneezing, hold a pillow firmly against your incision with both hands. This is called "splinting." Doing this helps protect your incision. It also decreases belly discomfort.  Please call the Pre-admissions Testing Dept. at [phone] if you  have any questions about these instructions.  Visitor Passes   All visitors, including children, need an identification sticker when visiting. These stickers must be worn where they can be seen.   Labor & Delivery  Laboring women of any age may have one designated support person and one other visitor aged 44 and older at a time, and a doula registered with [facility] for the labor and  delivery phase of their stay. (Doulas not registered with  are counted as visitors.) The visitor may switch with other visitors. Visitation is allowed 24 hours per day.  No children under the age of 50. The designated support person or a visitor may stay overnight in the room.  Mother Baby Unit, OB Specialty and Gynecological Care  A designated support person and three other visitors of any age may visit. The three visitors may switch out. The designated support person or a visitor aged 71 or older may stay overnight in the room. During the postpartum period (up to 6 weeks), if the mother is the patient, she can have her newborn stay with her if there is another support person present who can be responsible for the baby.   Preparing the Skin Before Surgery     To help prevent the risk of infection at your surgical site, we are now providing you with rinse-free Sage 2% Chlorhexidine Gluconate (CHG) disposable wipes.  The night before surgery: Shower or bathe with warm water. Do not apply perfume, lotions, powders. Wait one hour after shower. Skin should be dry and cool. Open Sage wipe package - 6 disposable cloths are inside. Wipe body using one cloth for the right arm, one cloth for the left arm, one cloth for the right leg, one cloth for the left leg, one cloth for the chest/abdomen area (do not use on breasts if breast feeding), and one cloth for the back. 5. Do not rinse, allow to dry. 6. Skin may fee "tacky" for several minutes. 7. Dress in clean clothes. 8. Place clean sheets on your bed and do not sleep with pets.  REPEAT ABOVE ON THE MORNING OF SURGERY PRIOR TO ARRIVING TO THE HOSPITAL.   Su procedimiento est programado para: viernes 18 de agosto Hora de llegada: llame a Recruitment consultant anterior a su cesrea programada para averiguar su hora de llegada. [phone].  Llegada: si su hora de llegada es antes de las 6:00 a. m., ingrese por la entrada de  la sala de emergencias y se Music therapist a Neurosurgeon. Si su hora de llegada es a las 6:00 a. m. o ms tarde, ingrese al [employer] y siga las instrucciones del recepcionista.  RECORDAR: Las instrucciones que no se siguen por Airline pilot en un riesgo mdico grave, que puede incluir la [place]; o segn el criterio de su cirujano y Scientific laboratory technician, es posible que sea Aeronautical engineer su Leisure centre manager.  No coma alimentos despus de la medianoche de la noche anterior a la ciruga. No masticar chicle, pastillas o caramelos duros.  Sin embargo, puede beber lquidos [employer] 2 horas antes de la fecha prevista para la [place]. No beba nada dentro de las 2 horas de su hora de llegada programada.  Los lquidos claros incluyen: - agua - jugo de manzana sin pulpa - gatorade (no colores ROJOS) - caf o t solo (NO agregue leche o cremas al caf o t) NO beba nada que no est en esta lista.  NO TOME NINGN MEDICAMENTO LA  MAANA DE LA CIRUGA  Una semana antes de la ciruga: a partir del 11 de agosto Detener los antiinflamatorios (AINE) como Advil, Aleve, Ibuprofen, Motrin, Naproxen, Naprosyn y productos a base de aspirina como Excedrin, Goodys Powder, [employer]. Suspenda CUALQUIER suplemento de venta libre hasta despus de la [place]. Sin embargo, puede Educational psychologist tomando Tylenol si es necesario para Marketing executive de la [place].  No alcohol por 24 horas antes o despus de la [place].  No fumar, incluidos los cigarrillos electrnicos, durante las 24 horas previas a la [place]. No productos de tabaco masticables durante al menos 6 horas antes de la [place]. Sin parches de Optometrist de la [place].  En la maana de la ciruga cepllese los dientes con pasta dental y agua, [place] enjuagarse la boca con enjuague bucal si lo desea. No trague ninguna pasta de dientes o enjuague bucal.  Use toallitas CHG como se indica en la hoja de instrucciones.  No use joyas,  maquillaje, horquillas para el cabello, clips o esmalte de uas.  No use lociones, talcos ni perfumes.  No se afeite el cuerpo desde el cuello hacia abajo 48 horas antes de la ciruga en caso de que se corte, lo que podra dejar un sitio para la infeccin. Adems, la piel recin afeitada puede irritarse si se [place] el jabn CHG.  No se pueden usar lentes de contacto, audfonos ni dentaduras postizas en la ciruga.  No lleve objetos de valor al hospital. [facility] no se responsabiliza por pertenencias u objetos de valor extraviados o perdidos.  Informe a su mdico si hay algn cambio en su condicin mdica (resfriado, fiebre, infeccin).  Use ropa cmoda (especfica para su tipo de [place]) al hospital.  Despus de la ciruga, puede ayudar a prevenir complicaciones pulmonares haciendo ejercicios de respiracin. Tome respiraciones profundas y tosa cada 1-2 horas. Su mdico puede ordenar un dispositivo llamado espirmetro de incentivo para ayudarlo a respirar profundamente. Al toser o estornudar, sostenga una almohada firmemente contra la incisin con ambas manos. Esto se llama "entablillado". Hacer esto ayuda a proteger su incisin. Tambin disminuye las molestias abdominales.  Llame al [place] de [place]mis[place] al [phone] si tiene alguna pregunta sobre estas instrucciones.  Pases de visitante  [employer], incluidos los nios, necesitan una etiqueta de identificacin cuando visiten. Estas calcomanas deben usarse donde se puedan ver.  Ihor Dow y [address] mujeres en trabajo de parto de cualquier edad pueden tener una persona de apoyo designada y otro visitante de 16 aos o ms a Licensed conveyancer, y Physiological scientist doula registrada en [place] para la fase de [place] de [place] y [place] de su estada. (Las doulas que no estn registradas en [place] se cuentan como visitantes). El visitante puede cambiar con otros visitantes. Las visitas estn permitidas las 24 horas del  da. No nios menores de 16 aos. La persona de apoyo designada o un visitante pueden pasar la noche en la habitacin.  [facility] [place], [place] Obstetricia y Atencin Ginecolgica  Neomia Dear persona de apoyo designada y otros tres visitantes de cualquier edad pueden visitar. Los tres visitantes pueden Multimedia programmer. La persona de apoyo designada o un visitante mayor de 16 aos puede pasar la noche en la habitacin. Durante el puerperio (hasta 6 semanas), si la madre es la [place], [place] quedarse con su recin nacido si hay otra persona de apoyo presente que pueda [employer] del beb.

## 2022-04-23 ENCOUNTER — Encounter: Payer: Self-pay | Admitting: Urgent Care

## 2022-04-23 ENCOUNTER — Encounter
Admission: RE | Admit: 2022-04-23 | Discharge: 2022-04-23 | Disposition: A | Discharge status: Home / Self Care | Payer: BC Managed Care – PPO | Source: Ambulatory Visit | Attending: Obstetrics and Gynecology | Admitting: Obstetrics and Gynecology

## 2022-04-23 DIAGNOSIS — Z01812 Encounter for preprocedural laboratory examination: Secondary | ICD-10-CM | POA: Insufficient documentation

## 2022-04-23 LAB — CBC
HCT: 36 % (ref 36.0–46.0)
Hemoglobin: 12 g/dL (ref 12.0–15.0)
MCH: 33.1 pg (ref 26.0–34.0)
MCHC: 33.3 g/dL (ref 30.0–36.0)
MCV: 99.4 fL (ref 80.0–100.0)
Platelets: 163 10*3/uL (ref 150–400)
RBC: 3.62 MIL/uL — ABNORMAL LOW (ref 3.87–5.11)
RDW: 14.5 % (ref 11.5–15.5)
WBC: 6.5 10*3/uL (ref 4.0–10.5)
nRBC: 0 % (ref 0.0–0.2)

## 2022-04-23 LAB — BASIC METABOLIC PANEL
Anion gap: 7 (ref 5–15)
BUN: 7 mg/dL (ref 6–20)
CO2: 22 mmol/L (ref 22–32)
Calcium: 8.6 mg/dL — ABNORMAL LOW (ref 8.9–10.3)
Chloride: 108 mmol/L (ref 98–111)
Creatinine, Ser: 0.55 mg/dL (ref 0.44–1.00)
GFR, Estimated: >60 mL/min (ref >60)
Glucose, Bld: 120 mg/dL — ABNORMAL HIGH (ref 70–99)
Potassium: 3.4 mmol/L — ABNORMAL LOW (ref 3.5–5.1)
Sodium: 137 mmol/L (ref 135–145)

## 2022-04-23 LAB — TYPE AND SCREEN
ABO/RH(D): A POS
Antibody Screen: NEGATIVE
Extend sample reason: UNDETERMINED

## 2022-04-24 MED ORDER — ORAL CARE MOUTH RINSE: 15.0000 mL | Freq: Once | OROMUCOSAL | Status: AC

## 2022-04-24 MED ORDER — LACTATED RINGERS IV SOLN: Freq: Once | INTRAVENOUS | Status: AC

## 2022-04-24 MED ORDER — CEFAZOLIN SODIUM-DEXTROSE 2-4 GM/100ML-% IV SOLN
2.0000 g | INTRAVENOUS | Status: AC
  Administered 2022-04-25: 2 g via INTRAVENOUS
  Filled 2022-04-24: qty 100

## 2022-04-24 MED ORDER — SOD CITRATE-CITRIC ACID 500-334 MG/5ML PO SOLN
30.0000 mL | ORAL | Status: AC
  Administered 2022-04-25: 30 mL via ORAL

## 2022-04-24 MED ORDER — CHLORHEXIDINE GLUCONATE 0.12 % MT SOLN
15.0000 mL | Freq: Once | OROMUCOSAL | Status: AC
  Administered 2022-04-25: 15 mL via OROMUCOSAL
  Filled 2022-04-24: qty 15

## 2022-04-24 MED ORDER — LACTATED RINGERS IV SOLN: INTRAVENOUS | Status: DC

## 2022-04-25 ENCOUNTER — Encounter: Payer: Self-pay | Admitting: Obstetrics and Gynecology

## 2022-04-25 ENCOUNTER — Inpatient Hospital Stay
Admission: RE | Admit: 2022-04-25 | Discharge: 2022-04-27 | DRG: 787 | Disposition: A | Discharge status: Home / Self Care | Payer: BC Managed Care – PPO | Attending: Obstetrics and Gynecology | Admitting: Obstetrics and Gynecology

## 2022-04-25 ENCOUNTER — Encounter
Admission: RE | Disposition: A | Discharge status: Home / Self Care | Payer: Self-pay | Source: Home / Self Care | Attending: Obstetrics and Gynecology

## 2022-04-25 ENCOUNTER — Inpatient Hospital Stay: Payer: BC Managed Care – PPO | Admitting: Urgent Care

## 2022-04-25 ENCOUNTER — Other Ambulatory Visit: Payer: Self-pay

## 2022-04-25 ENCOUNTER — Inpatient Hospital Stay: Payer: BC Managed Care – PPO | Admitting: Anesthesiology

## 2022-04-25 DIAGNOSIS — D62 Acute posthemorrhagic anemia: Secondary | ICD-10-CM | POA: Diagnosis not present

## 2022-04-25 DIAGNOSIS — O34219 Maternal care for unspecified type scar from previous cesarean delivery: Principal | ICD-10-CM | POA: Diagnosis present

## 2022-04-25 DIAGNOSIS — Z3A39 39 weeks gestation of pregnancy: Secondary | ICD-10-CM | POA: Diagnosis not present

## 2022-04-25 DIAGNOSIS — O9081 Anemia of the puerperium: Secondary | ICD-10-CM | POA: Diagnosis not present

## 2022-04-25 DIAGNOSIS — O34211 Maternal care for low transverse scar from previous cesarean delivery: Principal | ICD-10-CM | POA: Diagnosis present

## 2022-04-25 SURGERY — Surgical Case
Anesthesia: Spinal

## 2022-04-25 MED ORDER — KETOROLAC TROMETHAMINE 30 MG/ML IJ SOLN: 30.0000 mg | Freq: Four times a day (QID) | INTRAMUSCULAR | Status: AC | PRN

## 2022-04-25 MED ORDER — ONDANSETRON HCL 4 MG/2ML IJ SOLN
INTRAMUSCULAR | Status: DC | PRN
  Administered 2022-04-25: 4 mg via INTRAVENOUS

## 2022-04-25 MED ORDER — PRENATAL MULTIVITAMIN CH
1.0000 | ORAL_TABLET | Freq: Every day | ORAL | Status: DC
  Administered 2022-04-26: 1 via ORAL
  Filled 2022-04-25: qty 1

## 2022-04-25 MED ORDER — PHENYLEPHRINE HCL-NACL 20-0.9 MG/250ML-% IV SOLN
INTRAVENOUS | Status: DC | PRN
  Administered 2022-04-25: 50 ug/min via INTRAVENOUS

## 2022-04-25 MED ORDER — BUPIVACAINE IN DEXTROSE 0.75-8.25 % IT SOLN
INTRATHECAL | Status: DC | PRN
  Administered 2022-04-25: 1.5 mL via INTRATHECAL

## 2022-04-25 MED ORDER — SODIUM CHLORIDE (PF) 0.9 % IJ SOLN
INTRAMUSCULAR | Status: AC
  Filled 2022-04-25: qty 50

## 2022-04-25 MED ORDER — DIBUCAINE (PERIANAL) 1 % EX OINT: 1.0000 | TOPICAL_OINTMENT | CUTANEOUS | Status: DC | PRN

## 2022-04-25 MED ORDER — DIPHENHYDRAMINE HCL 50 MG/ML IJ SOLN: 12.5000 mg | INTRAMUSCULAR | Status: DC | PRN

## 2022-04-25 MED ORDER — COCONUT OIL OIL: 1.0000 | TOPICAL_OIL | Status: DC | PRN

## 2022-04-25 MED ORDER — SODIUM CHLORIDE (PF) 0.9 % IJ SOLN
INTRAMUSCULAR | Status: DC | PRN
  Administered 2022-04-25: 50 mL

## 2022-04-25 MED ORDER — MEPERIDINE HCL 25 MG/ML IJ SOLN: 6.2500 mg | INTRAMUSCULAR | Status: DC | PRN

## 2022-04-25 MED ORDER — EPHEDRINE SULFATE (PRESSORS) 50 MG/ML IJ SOLN
INTRAMUSCULAR | Status: DC | PRN
  Administered 2022-04-25: 5 mg via INTRAVENOUS

## 2022-04-25 MED ORDER — ACETAMINOPHEN 500 MG PO TABS
1000.0000 mg | ORAL_TABLET | Freq: Once | ORAL | Status: AC
  Administered 2022-04-25: 1000 mg via ORAL

## 2022-04-25 MED ORDER — LIDOCAINE HCL (PF) 1 % IJ SOLN
INTRAMUSCULAR | Status: DC | PRN
  Administered 2022-04-25: 2 mL via SUBCUTANEOUS

## 2022-04-25 MED ORDER — OXYTOCIN-SODIUM CHLORIDE 30-0.9 UT/500ML-% IV SOLN
INTRAVENOUS | Status: DC | PRN
  Administered 2022-04-25: 250 mL/h via INTRAVENOUS
  Administered 2022-04-25: 500 mL via INTRAVENOUS

## 2022-04-25 MED ORDER — NALOXONE HCL 0.4 MG/ML IJ SOLN: 0.4000 mg | INTRAMUSCULAR | Status: DC | PRN

## 2022-04-25 MED ORDER — MORPHINE SULFATE (PF) 0.5 MG/ML IJ SOLN
INTRAMUSCULAR | Status: DC | PRN
  Administered 2022-04-25: .1 mg via INTRATHECAL

## 2022-04-25 MED ORDER — GABAPENTIN 300 MG PO CAPS
300.0000 mg | ORAL_CAPSULE | Freq: Once | ORAL | Status: AC
Start: 2022-04-25 — End: 2022-04-25
  Administered 2022-04-25: 300 mg via ORAL

## 2022-04-25 MED ORDER — ACETAMINOPHEN 500 MG PO TABS
1000.0000 mg | ORAL_TABLET | Freq: Four times a day (QID) | ORAL | Status: AC
  Administered 2022-04-25 – 2022-04-26 (×4): 1000 mg via ORAL
  Filled 2022-04-25 (×5): qty 2

## 2022-04-25 MED ORDER — BUPIVACAINE HCL (PF) 0.5 % IJ SOLN
INTRAMUSCULAR | Status: AC
  Filled 2022-04-25: qty 60

## 2022-04-25 MED ORDER — DEXAMETHASONE SODIUM PHOSPHATE 10 MG/ML IJ SOLN
INTRAMUSCULAR | Status: DC | PRN
  Administered 2022-04-25: 10 mg via INTRAVENOUS

## 2022-04-25 MED ORDER — PHENYLEPHRINE HCL-NACL 20-0.9 MG/250ML-% IV SOLN
INTRAVENOUS | Status: AC
  Filled 2022-04-25: qty 250

## 2022-04-25 MED ORDER — FENTANYL CITRATE (PF) 100 MCG/2ML IJ SOLN
INTRAMUSCULAR | Status: AC
  Filled 2022-04-25: qty 2

## 2022-04-25 MED ORDER — ZOLPIDEM TARTRATE 5 MG PO TABS: 5.0000 mg | ORAL_TABLET | Freq: Every evening | ORAL | Status: DC | PRN

## 2022-04-25 MED ORDER — WITCH HAZEL-GLYCERIN EX PADS: 1.0000 | MEDICATED_PAD | CUTANEOUS | Status: DC | PRN

## 2022-04-25 MED ORDER — OXYCODONE HCL 5 MG PO TABS: 5.0000 mg | ORAL_TABLET | ORAL | Status: DC | PRN

## 2022-04-25 MED ORDER — SIMETHICONE 80 MG PO CHEW: 80.0000 mg | CHEWABLE_TABLET | ORAL | Status: DC | PRN

## 2022-04-25 MED ORDER — ENOXAPARIN SODIUM 40 MG/0.4ML IJ SOSY
40.0000 mg | PREFILLED_SYRINGE | INTRAMUSCULAR | Status: DC
  Administered 2022-04-26: 40 mg via SUBCUTANEOUS
  Filled 2022-04-25: qty 0.4

## 2022-04-25 MED ORDER — NALOXONE HCL 4 MG/10ML IJ SOLN: 1.0000 ug/kg/h | INTRAVENOUS | Status: DC | PRN

## 2022-04-25 MED ORDER — SCOPOLAMINE 1 MG/3DAYS TD PT72: 1.0000 | MEDICATED_PATCH | Freq: Once | TRANSDERMAL | Status: DC

## 2022-04-25 MED ORDER — TETANUS-DIPHTH-ACELL PERTUSSIS 5-2.5-18.5 LF-MCG/0.5 IM SUSY: 0.5000 mL | PREFILLED_SYRINGE | Freq: Once | INTRAMUSCULAR | Status: DC

## 2022-04-25 MED ORDER — GABAPENTIN 300 MG PO CAPS
300.0000 mg | ORAL_CAPSULE | Freq: Every day | ORAL | Status: DC
  Administered 2022-04-25 – 2022-04-26 (×2): 300 mg via ORAL
  Filled 2022-04-25 (×2): qty 1

## 2022-04-25 MED ORDER — MORPHINE SULFATE (PF) 0.5 MG/ML IJ SOLN
INTRAMUSCULAR | Status: AC
  Filled 2022-04-25: qty 10

## 2022-04-25 MED ORDER — MENTHOL 3 MG MT LOZG: 1.0000 | LOZENGE | OROMUCOSAL | Status: DC | PRN

## 2022-04-25 MED ORDER — OXYTOCIN-SODIUM CHLORIDE 30-0.9 UT/500ML-% IV SOLN: 2.5000 [IU]/h | INTRAVENOUS | Status: AC

## 2022-04-25 MED ORDER — SIMETHICONE 80 MG PO CHEW
80.0000 mg | CHEWABLE_TABLET | Freq: Three times a day (TID) | ORAL | Status: DC
Start: 2022-04-25 — End: 2022-04-27
  Administered 2022-04-25 – 2022-04-26 (×4): 80 mg via ORAL
  Filled 2022-04-25 (×4): qty 1

## 2022-04-25 MED ORDER — SODIUM CHLORIDE 0.9% FLUSH: 3.0000 mL | INTRAVENOUS | Status: DC | PRN

## 2022-04-25 MED ORDER — DIPHENHYDRAMINE HCL 25 MG PO CAPS: 25.0000 mg | ORAL_CAPSULE | ORAL | Status: DC | PRN

## 2022-04-25 MED ORDER — SENNOSIDES-DOCUSATE SODIUM 8.6-50 MG PO TABS
2.0000 | ORAL_TABLET | Freq: Every day | ORAL | Status: DC
  Administered 2022-04-26: 2 via ORAL
  Filled 2022-04-25: qty 2

## 2022-04-25 MED ORDER — MORPHINE SULFATE (PF) 2 MG/ML IV SOLN: 1.0000 mg | INTRAVENOUS | Status: DC | PRN

## 2022-04-25 MED ORDER — ONDANSETRON HCL 4 MG/2ML IJ SOLN
INTRAMUSCULAR | Status: AC
  Filled 2022-04-25: qty 2

## 2022-04-25 MED ORDER — ACETAMINOPHEN 500 MG PO TABS
ORAL_TABLET | ORAL | Status: AC
  Filled 2022-04-25: qty 2

## 2022-04-25 MED ORDER — KETOROLAC TROMETHAMINE 30 MG/ML IJ SOLN
30.0000 mg | Freq: Four times a day (QID) | INTRAMUSCULAR | Status: AC
  Administered 2022-04-25 – 2022-04-26 (×4): 30 mg via INTRAVENOUS
  Filled 2022-04-25 (×4): qty 1

## 2022-04-25 MED ORDER — GABAPENTIN 300 MG PO CAPS
ORAL_CAPSULE | ORAL | Status: AC
  Filled 2022-04-25: qty 1

## 2022-04-25 MED ORDER — PHENYLEPHRINE 80 MCG/ML (10ML) SYRINGE FOR IV PUSH (FOR BLOOD PRESSURE SUPPORT)
PREFILLED_SYRINGE | INTRAVENOUS | Status: AC
  Filled 2022-04-25: qty 10

## 2022-04-25 MED ORDER — IBUPROFEN 600 MG PO TABS
600.0000 mg | ORAL_TABLET | Freq: Four times a day (QID) | ORAL | Status: DC
  Administered 2022-04-26 – 2022-04-27 (×4): 600 mg via ORAL
  Filled 2022-04-25 (×4): qty 1

## 2022-04-25 MED ORDER — OXYTOCIN-SODIUM CHLORIDE 30-0.9 UT/500ML-% IV SOLN
INTRAVENOUS | Status: AC
  Administered 2022-04-25: 2.5 [IU]/h via INTRAVENOUS
  Filled 2022-04-25: qty 1000

## 2022-04-25 MED ORDER — ONDANSETRON HCL 4 MG/2ML IJ SOLN: 4.0000 mg | Freq: Three times a day (TID) | INTRAMUSCULAR | Status: DC | PRN

## 2022-04-25 MED ORDER — FENTANYL CITRATE (PF) 100 MCG/2ML IJ SOLN
INTRAMUSCULAR | Status: DC | PRN
Start: 2022-04-25 — End: 2022-04-25
  Administered 2022-04-25: 15 ug via INTRATHECAL

## 2022-04-25 MED ORDER — BUPIVACAINE HCL 0.5 % IJ SOLN
INTRAMUSCULAR | Status: DC | PRN
  Administered 2022-04-25: 60 mL

## 2022-04-25 SURGICAL SUPPLY — 31 items
BARRIER ADHS 3X4 INTERCEED (GAUZE/BANDAGES/DRESSINGS) ×1 IMPLANT
CHLORAPREP W/TINT 26 (MISCELLANEOUS) ×1 IMPLANT
DRSG TELFA 3X8 NADH (GAUZE/BANDAGES/DRESSINGS) ×1 IMPLANT
ELECT CAUTERY BLADE 6.4 (BLADE) ×1 IMPLANT
ELECT REM PT RETURN 9FT ADLT (ELECTROSURGICAL) ×1
ELECTRODE REM PT RTRN 9FT ADLT (ELECTROSURGICAL) ×1 IMPLANT
GAUZE SPONGE 4X4 12PLY STRL (GAUZE/BANDAGES/DRESSINGS) ×1 IMPLANT
GLOVE SURG SYN 8.0 (GLOVE) ×1 IMPLANT
GLOVE SURG SYN 8.0 PF PI (GLOVE) ×1 IMPLANT
GOWN STRL REUS W/ TWL LRG LVL3 (GOWN DISPOSABLE) ×2 IMPLANT
GOWN STRL REUS W/ TWL XL LVL3 (GOWN DISPOSABLE) ×1 IMPLANT
GOWN STRL REUS W/TWL LRG LVL3 (GOWN DISPOSABLE) ×2
GOWN STRL REUS W/TWL XL LVL3 (GOWN DISPOSABLE) ×1
MANIFOLD NEPTUNE II (INSTRUMENTS) ×1 IMPLANT
MAT PREVALON FULL STRYKER (MISCELLANEOUS) ×1 IMPLANT
NEEDLE HYPO 22GX1.5 SAFETY (NEEDLE) ×1 IMPLANT
NS IRRIG 1000ML POUR BTL (IV SOLUTION) ×1 IMPLANT
PACK C SECTION AR (MISCELLANEOUS) ×1 IMPLANT
PAD DRESSING TELFA 3X8 NADH (GAUZE/BANDAGES/DRESSINGS) ×1 IMPLANT
PAD OB MATERNITY 4.3X12.25 (PERSONAL CARE ITEMS) ×1 IMPLANT
PAD PREP 24X41 OB/GYN DISP (PERSONAL CARE ITEMS) ×1 IMPLANT
SCRUB CHG 4% DYNA-HEX 4OZ (MISCELLANEOUS) ×1 IMPLANT
STRAP SAFETY 5IN WIDE (MISCELLANEOUS) ×1 IMPLANT
SUCT VACUUM KIWI BELL (SUCTIONS) IMPLANT
SUT CHROMIC 1 CTX 36 (SUTURE) ×3 IMPLANT
SUT PLAIN GUT 0 (SUTURE) ×2 IMPLANT
SUT VIC AB 0 CT1 36 (SUTURE) ×2 IMPLANT
SUT VICRYL 3-0 27IN (SUTURE) IMPLANT
SYR 30ML LL (SYRINGE) ×2 IMPLANT
TRAP FLUID SMOKE EVACUATOR (MISCELLANEOUS) ×1 IMPLANT
WATER STERILE IRR 500ML POUR (IV SOLUTION) ×1 IMPLANT

## 2022-04-25 NOTE — Transfer of Care (Signed)
Immediate Anesthesia Transfer of Care Note  Patient: Kristin Pineda  Procedure(s) Performed: REPEAT CESAREAN SECTION  Patient Location: Mother/Baby  Anesthesia Type:Spinal  Level of Consciousness: awake, alert  and oriented  Airway & Oxygen Therapy: Patient Spontanous Breathing  Post-op Assessment: Report given to RN and Post -op Vital signs reviewed and stable  Post vital signs: Reviewed and stable  Last Vitals:  Vitals Value Taken Time  BP 110/64   Temp 97.29f   Pulse 74   Resp 12   SpO2 96     Last Pain:  Vitals:   04/25/22 1130  TempSrc:   PainSc: 0-No pain         Complications: No notable events documented.

## 2022-04-25 NOTE — Anesthesia Procedure Notes (Signed)
Spinal  Patient location during procedure: OR Start time: 04/25/2022 1:22 PM End time: 04/25/2022 1:26 PM Reason for block: surgical anesthesia Staffing Performed: resident/CRNA  Anesthesiologist: Darrin Nipper, MD Resident/CRNA: Tollie Eth, CRNA Performed by: Tollie Eth, CRNA Authorized by: Darrin Nipper, MD   Preanesthetic Checklist Completed: patient identified, IV checked, site marked, risks and benefits discussed, surgical consent, monitors and equipment checked, pre-op evaluation and timeout performed Spinal Block Patient position: sitting Prep: Betadine Patient monitoring: heart rate, continuous pulse ox, blood pressure and cardiac monitor Approach: midline Location: L4-5 Injection technique: single-shot Needle Needle type: [place] and Introducer  Needle gauge: 24 G Needle length: 9 cm Assessment Events: CSF return Additional Notes Negative paresthesia. Negative blood return. Positive free-flowing CSF. Expiration date of kit checked and confirmed. Patient tolerated procedure well, without complications.

## 2022-04-25 NOTE — Progress Notes (Signed)
Pt is 39+0 weeks and here for repeat cesarean section  . NPO . Labs reviewed. Declines BTL . All questions answered . Translator present . Proceed

## 2022-04-25 NOTE — Brief Op Note (Signed)
04/25/2022  2:42 PM  PATIENT:  Kristin Pineda  36 y.o. female  PRE-OPERATIVE DIAGNOSIS:  prior cesarean  POST-OPERATIVE DIAGNOSIS:  * No post-op diagnosis entered *  PROCEDURE:  Procedure(s): REPEAT CESAREAN SECTION (N/A)  SURGEON:  Surgeon(s) and Role:    * [facility], Ihor Austin, MD - Primary  PHYSICIAN ASSISTANT: Donato Schultz , CNM   ASSISTANTS: none   ANESTHESIA:   spinal  EBL:  QBL 790, IOF 900 , OU 70 cc clear    BLOOD ADMINISTERED:none  DRAINS: Urinary Catheter (Foley)   LOCAL MEDICATIONS USED:  MARCAINE     SPECIMEN:  No Specimen  DISPOSITION OF SPECIMEN:  N/A  COUNTS:  YES  TOURNIQUET:  * No tourniquets in log *  DICTATION: .Other Dictation: Dictation Number verbal  PLAN OF CARE: Admit to inpatient   PATIENT DISPOSITION:  PACU - hemodynamically stable.   Delay start of Pharmacological VTE agent (>24hrs) due to surgical blood loss or risk of bleeding: not applicable

## 2022-04-25 NOTE — Anesthesia Preprocedure Evaluation (Addendum)
Anesthesia Evaluation  Patient identified by MRN, date of birth, ID band Patient awake    Reviewed: Allergy & Precautions, NPO status , Patient's Chart, lab work & pertinent test results  History of Anesthesia Complications Negative for: history of anesthetic complications  Airway Mallampati: IV   Neck ROM: Full    Dental no notable dental hx.    Pulmonary neg pulmonary ROS,    Pulmonary exam normal breath sounds clear to auscultation       Cardiovascular Exercise Tolerance: Good negative cardio ROS Normal cardiovascular exam Rhythm:Regular Rate:Normal     Neuro/Psych negative neurological ROS     GI/Hepatic negative GI ROS,   Endo/Other  negative endocrine ROS  Renal/GU negative Renal ROS     Musculoskeletal   Abdominal   Peds  Hematology negative hematology ROS (+)   Anesthesia Other Findings 38 G1011 at 39 0/7 presenting for repeat c-section.  Reproductive/Obstetrics                            Anesthesia Physical Anesthesia Plan  ASA: 2  Anesthesia Plan: Spinal   Post-op Pain Management:    Induction:   PONV Risk Score and Plan: 2 and Ondansetron and Treatment may vary due to age or medical condition  Airway Management Planned: Natural Airway and Nasal Cannula  Additional Equipment:   Intra-op Plan:   Post-operative Plan:   Informed Consent: I have reviewed the patients History and Physical, chart, labs and discussed the procedure including the risks, benefits and alternatives for the proposed anesthesia with the patient or authorized representative who has indicated his/her understanding and acceptance.     Interpreter used for [employer] and Sales promotion account executive given  Plan Discussed with: Anesthesiologist, CRNA and Surgeon  Anesthesia Plan Comments: (Patient reports no bleeding problems and no anticoagulant use.  Plan for spinal with backup GA.  Patient consented  for risks of anesthesia including but not limited to:  - adverse reactions to medications - damage to eyes, teeth, lips or other oral mucosa - nerve damage due to positioning  - risk of bleeding, infection and or nerve damage from spinal that could lead to paralysis - risk of headache or failed spinal - damage to teeth, lips or other oral mucosa - sore throat or hoarseness - damage to heart, brain, nerves, lungs, other parts of body or loss of life  Patient voiced understanding.)       Anesthesia Quick Evaluation

## 2022-04-25 NOTE — Discharge Summary (Signed)
Obstetrical Discharge Summary  Patient Name: Kristin Pineda DOB: 1985-10-23 MRN: [id number]  Date of Admission: 04/25/2022 Date of Delivery: 04/25/22 Delivered by: Beverly Gust MD Date of Discharge: 04/27/2022  Primary OB: GBT:DVVOHYW'V last menstrual period was 07/26/2021. EDC Estimated Date of Delivery: 05/02/22 Gestational Age at Delivery: [redacted]w[redacted]d   Antepartum complications: previous c/s  Admitting Diagnosis: 39+0 weeks , elective C/S Secondary Diagnosis: Patient Active Problem List   Diagnosis Date Noted   Previous cesarean delivery affecting pregnancy 04/25/2022   Post-term pregnancy, 40-42 weeks of gestation 10/20/2017   Augmentation: N/A Complications: None Intrapartum complications/course: uncomplictaed repeat LTCS  Date of Delivery: 04/27/2022  Delivered By: Beverly Gust MD Delivery Type: repeat cesarean section, low transverse incision Anesthesia: spinal Placenta: manual Laceration: none Episiotomy: none Newborn Data: Live born child female  Birth Weight:  3270gm APGAR: , 9/10  Newborn Delivery   Birth date/time:  Delivery type: C-Section, Low Transverse Trial of labor: No C-section categorization: Repeat     Postpartum Procedures: None  Edinburgh:     04/25/2022    7:17 PM  Edinburgh Postnatal Depression Scale Screening Tool  I have been able to laugh and see the funny side of things. 0  I have looked forward with enjoyment to things. 0  I have blamed myself unnecessarily when things went wrong. 0  I have been anxious or worried for no good reason. 0  I have felt scared or panicky for no good reason. 0  Things have been getting on top of me. 0  I have been so unhappy that I have had difficulty sleeping. 0  I have felt sad or miserable. 0  I have been so unhappy that I have been crying. 0  The thought of harming myself has occurred to me. 0  Edinburgh Postnatal Depression Scale Total 0    Post partum course:  Patient had an uncomplicated  postpartum course.  By time of discharge on POD#2, her pain was controlled on oral pain medications; she had appropriate lochia and was ambulating, voiding without difficulty, tolerating regular diet and passing flatus.   She was deemed stable for discharge to home.    Discharge Physical Exam:  BP 108/68 (BP Location: Right Arm)   Pulse 67   Temp 97.9 F (36.6 C)   Resp 18   Ht 5\' 3"  (1.6 m)   Wt 73.9 kg   LMP 07/26/2021   SpO2 100%   Breastfeeding Unknown   BMI 28.87 kg/m   General: NAD CV: RRR Pulm: CTABL, nl effort ABD: s/nd/nt, fundus firm and below the umbilicus Lochia: moderate Incision: c/d/I, covered with occlusive OP Site dressing  DVT Evaluation: LE non-ttp, no evidence of DVT on exam.  Hemoglobin  Date Value Ref Range Status  04/26/2022 10.9 (L) 12.0 - 15.0 g/dL Final   HCT  Date Value Ref Range Status  04/26/2022 32.0 (L) 36.0 - 46.0 % Final     Disposition: stable, discharge to home. Baby Feeding: breastmilk Baby Disposition: home with mom  Rh Immune globulin given: Rh pos  Rubella vaccine given: Immune  Tdap vaccine given in AP or PP setting: declined  Flu vaccine given in AP or PP setting: declined   Contraception: TBD  Prenatal Labs:   ABO, Rh:  A+ Antibody:  Neg Rubella:  Imm , Vz Imm RPR:   NR HBsAg:   neg HIV:   Neg GBS:   not done   Plan:  [place] D [place] was discharged to home in good condition.  Follow-up appointment with delivering provider in 2 weeks.  Discharge Medications: Allergies as of 04/27/2022       Reactions   Penicillins Hives        Medication List     TAKE these medications    acetaminophen 500 MG tablet Commonly known as: TYLENOL Take 2 tablets (1,000 mg total) by mouth every 6 (six) hours as needed.   ibuprofen 600 MG tablet Commonly known as: ADVIL Take 1 tablet (600 mg total) by mouth every 6 (six) hours as needed for mild pain or cramping.   multivitamin-prenatal 27-0.8 MG Tabs tablet Take 1 tablet  by mouth daily at 12 noon.   oxyCODONE 5 MG immediate release tablet Commonly known as: Oxy IR/ROXICODONE Take 1 tablet (5 mg total) by mouth every 4 (four) hours as needed for up to 7 days for moderate pain.         Follow-up Information     Raahim Shartzer, Ihor Austin, MD. Schedule an appointment as soon as possible for a visit in 2 week(s).   Specialty: Obstetrics and Gynecology Why: post-op incision check Contact information: [address] [place] [address] [phone]                 Signed:  Margaretmary Eddy, CNM Certified Nurse Midwife [facility]

## 2022-04-25 NOTE — Op Note (Unsigned)
Kristin Pineda, PICKART MEDICAL RECORD NO: [id number] ACCOUNT NO: [id number] DATE OF BIRTH: 1986-02-12 FACILITY: [facility] LOCATION: [facility] PHYSICIAN: Suzy Bouchard, MD  Operative Report   DATE OF PROCEDURE: 04/25/2022  PREOPERATIVE DIAGNOSES: 1.  39+0 weeks estimated gestational age. 2.  Elective repeat cesarean section.  POSTOPERATIVE DIAGNOSES: 1.  39+0 weeks estimated gestational age. 2.  Elective repeat cesarean section. 3. Vigorous female delivered.  PROCEDURE:  Repeat low transverse cesarean section.  ANESTHESIA:  Spinal.  SURGEON:  Suzy Bouchard, MD.  ASSISTANT:  Donato Schultz, certified nurse midwife.  INDICATIONS:  A 36 year old gravida 3, para 1 patient with a prior cesarean section and elects for repeat cesarean section.  She is 39+0 weeks estimated gestational age on the date of the procedure.  The patient declines bilateral tubal ligation, i.e.,  sterilization.  DESCRIPTION OF PROCEDURE:  After adequate spinal anesthesia, the patient was placed in dorsal supine position with hip roll on the right side.  The patient's abdomen was prepped and draped in normal sterile fashion.  Timeout was performed.  The patient  did receive 2 grams of IV Ancef prior to commencement of the case.  Pfannenstiel incision was made 2 fingerbreadths above the symphysis pubis.  Sharp dissection was used to identify the fascia.  Fascia was opened in the midline and opened in a transverse  fashion.  Some dense adhesions of the peritoneum and in between the recti muscles allowed for sharp dissection and then ultimate entry into the peritoneal cavity.  Peritoneum was stretched and there was some attenuation of the serosa and muscularis of  the bladder, but no defects noted into the bladder.  The vesicouterine peritoneal fold was identified and a bladder flap was created and reflected inferiorly.  Low transverse uterine incision was made.  Upon entry into the endometrial cavity, clear  fluid  resulted and the incision was extended with blunt transverse traction.  Kiwi vacuum was applied to the occiput of the infant and with gentle pulling and fundal pressure, the head was delivered and the vacuum was removed.  Shoulders of the infant and  body were delivered without difficulty.  Vigorous female was then dried on the abdomen for 60 seconds and cord was doubly clamped and infant was passed to nursery staff who assigned Apgar scores of 9 and 10.  Fetal weight 3270 grams.  The placenta was  then manually delivered and the uterus was exteriorized.  The endometrial cavity was wiped clean with laparotomy tape and the uterine incision was closed with 1 chromic suture in a running locking fashion, good approximation of edges.  Two additional  figure-of-eight sutures were required for hemostasis.  Fallopian tubes and ovaries appeared normal.  The posterior cul-de-sac was then irrigated and suctioned and the uterus was placed back into the abdominal cavity and the pericolic gutters were wiped  clean with laparotomy tape.  Uterine incision again appeared hemostatic.  Attenuation of the bladder serosa muscularis was noted, again no defects noted into the mucosal aspect of the bladder.  Reinforcing running suture of 3-0 Vicryl was used to  reinforce the defect and good repair was noted.  Urine was clear at this time and deemed adequate and not thought to have any defect into the bladder wall.  Interceed was placed over the uterine incision in a T-shaped fashion and the fascia was then  closed with 0 Vicryl suture in a running nonlocking fashion, two separate sutures were used.  Good approximation of edges.  Subcutaneous tissues were then  irrigated and bovied and the skin was reapproximated with Insorb.  Good cosmetic effect was noted.   Fascial edges were injected with a solution of 60 mL of 0.5% Marcaine plus 20 mL of normal saline.  40 mL solution was used.  Subcutaneous tissues were irrigated and  bovied for hemostasis and the skin was reapproximated with Insorb absorbable staples.   Good cosmetic effect.  Additional 30 mL of Marcaine solution was used beneath the skin.  There were no complications.  QUANTITATIVE BLOOD LOSS:  790 mL.  INTRAOPERATIVE FLUIDS:  900 mL.  URINE OUTPUT:  70 mL.  DISPOSITION:  The patient was taken to recovery room in good condition.   NIK D: 04/25/2022 3:01:59 pm T: 04/25/2022 9:59:00 pm  JOB: [id number]/ [id number]

## 2022-04-26 LAB — CBC
HCT: 32 % — ABNORMAL LOW (ref 36.0–46.0)
Hemoglobin: 10.9 g/dL — ABNORMAL LOW (ref 12.0–15.0)
MCH: 33.2 pg (ref 26.0–34.0)
MCHC: 34.1 g/dL (ref 30.0–36.0)
MCV: 97.6 fL (ref 80.0–100.0)
Platelets: 167 10*3/uL (ref 150–400)
RBC: 3.28 MIL/uL — ABNORMAL LOW (ref 3.87–5.11)
RDW: 14.2 % (ref 11.5–15.5)
WBC: 13.8 10*3/uL — ABNORMAL HIGH (ref 4.0–10.5)
nRBC: 0 % (ref 0.0–0.2)

## 2022-04-26 LAB — RPR: RPR Ser Ql: NONREACTIVE

## 2022-04-26 NOTE — Anesthesia Post-op Follow-up Note (Signed)
  Anesthesia Pain Follow-up Note  Patient: Kristin Pineda  Day #: 1  Date of Follow-up: 04/26/2022 Time: 10:12 AM  Last Vitals:  Vitals:   04/26/22 0334 04/26/22 0735  BP: 117/64 110/74  Pulse: 70 79  Resp: 18 17  Temp: 37 C 36.6 C  SpO2: 98% 100%    Level of Consciousness: alert  Pain: none   Side Effects:None  Catheter Site Exam:clean  Anti-Coag Meds (From admission, onward)   Start     Dose/Rate Route Frequency Ordered Stop   04/26/22 1300  enoxaparin (LOVENOX) injection 40 mg        40 mg Subcutaneous Every 24 hours 04/25/22 1629         Plan: D/C from anesthesia care at surgeon's request  Foye Deer

## 2022-04-26 NOTE — Lactation Note (Signed)
This note was copied from a baby's chart. Lactation Consultation Note  Patient Name: Kristin Pineda [id number] Date: 04/26/2022 Reason for consult: Follow-up assessment;Term Age:38 hours  Maternal Data Does the patient have breastfeeding experience prior to this delivery?: Yes How long did the patient breastfeed?: 3.5 yr  Feeding Mother's Current Feeding Choice: Breast Milk BAby just had finished nursing on left breast, mom burped and baby still rooting, encouraged to offer right breast, baby latched easily and is  nursing well in cradle hold with audible swallows  LATCH Score Latch: Grasps breast easily, tongue down, lips flanged, rhythmical sucking.  Audible Swallowing: Spontaneous and intermittent  Type of Nipple: Everted at rest and after stimulation  Comfort (Breast/Nipple): Filling, red/small blisters or bruises, mild/mod discomfort  Hold (Positioning): No assistance needed to correctly position infant at breast.  LATCH Score: 9   Lactation Tools Discussed/Used  LC name and no written on white board  Interventions Interventions: Skin to skin;Support pillows (purelan packets given with instruction in use)  Discharge [facility] Program: No  Consult Status Consult Status: PRN    Dyann Kief 04/26/2022, 12:24 PM

## 2022-04-26 NOTE — Anesthesia Postprocedure Evaluation (Signed)
Anesthesia Post Note  Patient: Kristin Pineda  Procedure(s) Performed: REPEAT CESAREAN SECTION  Patient location during evaluation: Mother Baby Anesthesia Type: Spinal Level of consciousness: awake and alert Pain management: pain level controlled Vital Signs Assessment: post-procedure vital signs reviewed and stable Respiratory status: spontaneous breathing, nonlabored ventilation and respiratory function stable Cardiovascular status: blood pressure returned to baseline and stable Postop Assessment: no apparent nausea or vomiting Anesthetic complications: no   No notable events documented.   Last Vitals:  Vitals:   04/26/22 0334 04/26/22 0735  BP: 117/64 110/74  Pulse: 70 79  Resp: 18 17  Temp: 37 C 36.6 C  SpO2: 98% 100%    Last Pain:  Vitals:   04/26/22 0800  TempSrc:   PainSc: 0-No pain                 Foye Deer

## 2022-04-26 NOTE — Progress Notes (Signed)
Postop Day  1  Subjective: no complaints, up ad lib, and tolerating PO  Doing well, no concerns. Ambulating without difficulty, pain managed with PO meds, tolerating regular diet, and voiding without difficulty.   No fever/chills, chest pain, shortness of breath, nausea/vomiting, or leg pain. No nipple or breast pain. No headache, visual changes, or RUQ/epigastric pain.  Objective: BP 110/74 (BP Location: Right Arm)   Pulse 79   Temp 97.8 F (36.6 C) (Oral)   Resp 17   Ht 5\' 3"  (1.6 m)   Wt 73.9 kg   LMP 07/26/2021   SpO2 100%   Breastfeeding Unknown   BMI 28.87 kg/m    Physical Exam:  General: alert, cooperative, and no distress Breasts: soft/nontender CV: RRR Pulm: nl effort, CTABL Abdomen: soft, non-tender, active bowel sounds Uterine Fundus: firm Incision: covered with dressing, drainage noted over 2/3 of dressing, line of demarcation drawn around 0500 this morning, no new significant drainage since  Perineum: minimal edema, intact Lochia: appropriate DVT Evaluation: No evidence of DVT seen on physical exam.  Recent Labs    04/23/22 1101 04/26/22 0649  HGB 12.0 10.9*  HCT 36.0 32.0*  WBC 6.5 13.8*  PLT 163 167    Assessment/Plan: 36 y.o. 04/28/22 postpartum day # 1  -Continue routine postpartum care -Lactation consult PRN for breastfeeding  -Acute blood loss anemia - hemodynamically stable and asymptomatic; start PO ferrous sulfate BID with stool softeners  -Immunization status:   all immunizations up to date -Will change dressing after 24 hours from placement to OP site. Sooner if significantly more drainage is present.     Disposition: Continue inpatient postpartum care    LOS: 1 day   [id number], Gustavo Lah 04/26/2022, 9:22 AM   ----- 04/28/2022  Certified Nurse Midwife [facility]

## 2022-04-27 ENCOUNTER — Ambulatory Visit: Payer: Self-pay

## 2022-04-27 MED ORDER — IBUPROFEN 600 MG PO TABS: 600.0000 mg | ORAL_TABLET | Freq: Four times a day (QID) | ORAL | 0 refills | Status: AC | PRN

## 2022-04-27 MED ORDER — OXYCODONE HCL 5 MG PO TABS: 5.0000 mg | ORAL_TABLET | ORAL | 0 refills | Status: AC | PRN

## 2022-04-27 MED ORDER — ACETAMINOPHEN 500 MG PO TABS: 1000.0000 mg | ORAL_TABLET | Freq: Four times a day (QID) | ORAL | 2 refills | Status: AC | PRN

## 2022-04-27 NOTE — Progress Notes (Signed)
Patient was responsive to teaching. Had few questions. RN was able to answer all questions. Patient to be wheeled out when ready. 

## 2022-04-27 NOTE — Lactation Note (Addendum)
This note was copied from a baby's chart. Lactation Consultation Note  Patient Name: Girl Aleeya Veitch [id number] Date: 04/27/2022 Reason for consult: Follow-up assessment;Term Age:38 hours  Maternal Data  This is mom's 2nd baby, Repeat C/S vacuum assisted . Mom is an experienced breastfeeding mother, breastfed first child for 3 years. On follow-up today mom reports she does not have a manual pump and would like a pump for home use. Per mom baby has been latching and breastfeeding well . Mom does not have any additional breastfeeding questions.  Has patient been taught Hand Expression?: Yes Does the patient have breastfeeding experience prior to this delivery?: Yes How long did the patient breastfeed?: 3 years  Feeding Mother's Current Feeding Choice: Breast Milk   Lactation Tools Discussed/Used  Harmony breastpump.  Interventions Interventions: Breast feeding basics reviewed;Hand pump;Education  Discharge Discharge Education: Engorgement and breast care;Warning signs for feeding baby;Other (comment) (Mom reports she will take the baby for follow-up, [facility]. Mom provided [facility] lactation contact info on discharge papers. Mom understands if she has questions or need LC assistance after she goes home she can call LC.) Pump: Manual (Provided Harmony manual pump: reviewed setup/cleaning, milk storage guidelines for the normal newborn at home.)  All discussed with assistance of Spanish interpreter via IPAD service.  Consult Status Consult Status: Complete  Update provided to care nurse.  Fuller Song 04/27/2022, 1:10 PM

## 2022-04-27 NOTE — Discharge Instructions (Signed)
Cesarean Delivery, Care After Refer to this sheet in the next few weeks. These instructions provide you with information on caring for yourself after your procedure. Your health care provider may also give you specific instructions. Your treatment has been planned according to current medical practices, but problems sometimes occur. Call your health care provider if you have any problems or questions after you go home. HOME CARE INSTRUCTIONS  Please leave honey comb dressing (OP Site) on for 7 days.  You may shower during this period but turn your back to the water so that the dressing does not get directly saturated by the water.   You may take the dressing off on day 7.  The easiest way to do it is in the shower.  Allow the water to run over the dressing and it usually comes off easier.   Only take over-the-counter or prescription medications as directed by your health care provider. Do not drink alcohol, especially if you are breastfeeding or taking medication to relieve pain. Do not  smoke tobacco. Continue to use good perineal care. Good perineal care includes: Wiping your perineum from front to back. Keeping your perineum clean. Check your surgical cut (incision) daily for increased redness, drainage, swelling, or separation of skin. Shower and clean your incision gently with soap and water every day, by letting warm and soapy water run over the incision, and then pat it dry. If your health care provider says it is okay, leave the incision uncovered. Use a bandage (dressing) if the incision is draining fluid or appears irritated. If the adhesive strips across the incision do not fall off within 7 days, carefully peel them off, after a shower. Hug a pillow when coughing or sneezing until your incision is healed. This helps to relieve pain. Do not use tampons, douches or have sexual intercourse, until your health care provider says it is okay. Wear a well-fitting bra that provides breast  support. Limit wearing support panties or control-top hose. Drink enough fluids to keep your urine clear or pale yellow. Eat high-fiber foods such as whole grain cereals and breads, brown rice, beans, and fresh fruits and vegetables every day. These foods may help prevent or relieve constipation. Resume activities such as climbing stairs, driving, lifting, exercising, or traveling as directed by your health care provider. Try to have someone help you with your household activities and your newborn for at least a few days after you leave the hospital. Rest as much as possible. Try to rest or take a nap when your newborn is sleeping. Increase your activities gradually. Do not lift more than 15lbs until directed by a provider. Keep all of your scheduled postpartum appointments. It is very important to keep your scheduled follow-up appointments. At these appointments, your health care provider will be checking to make sure that you are healing physically and emotionally. SEEK MEDICAL CARE IF:  You are passing large clots from your vagina. Save any clots to show your health care provider. You have a foul smelling discharge from your vagina. You have trouble urinating. You are urinating frequently. You have pain when you urinate. You have a change in your bowel movements. You have increasing redness, pain, or swelling near your incision. You have pus draining from your incision. Your incision is separating. You have painful, hard, or reddened breasts. You have a severe headache. You have blurred vision or see spots. You feel sad or depressed. You have thoughts of hurting yourself or your newborn. You have questions   about your care, the care of your newborn, or medications. You are dizzy or light-headed. You have a rash. You have pain, redness, or swelling at the site of the removed intravenous access (IV) tube. You have nausea or vomiting. You stopped breastfeeding and have not had a  menstrual period within 12 weeks of stopping. You are not breastfeeding and have not had a menstrual period within 12 weeks of delivery. You have a fever. SEEK IMMEDIATE MEDICAL CARE IF: You have persistent pain. You have chest pain. You have shortness of breath. You faint. You have leg pain. You have stomach pain. Your vaginal bleeding saturates 2 or more sanitary pads in 1 hour. MAKE SURE YOU:  Understand these instructions. Will watch your condition. Will get help right away if you are not doing well or get worse. Document Released: 05/17/2002 Document Revised: 01/09/2014 Document Reviewed: 04/21/2012 Exit[employer] Patient Information 2015 [employer]. This information is not intended to replace advice given to you by your health care provider. Make sure you discuss any questions you have with your health care provider.  

## 2022-05-03 IMAGING — US US MFM OB DETAIL+14 WK
1 series · 13 of 28 positions shown · non-contrast
Comparison: none

[Series 1: us mfm ob detail+14 wk · 13 of 97 slices shown]
[im 4/97]
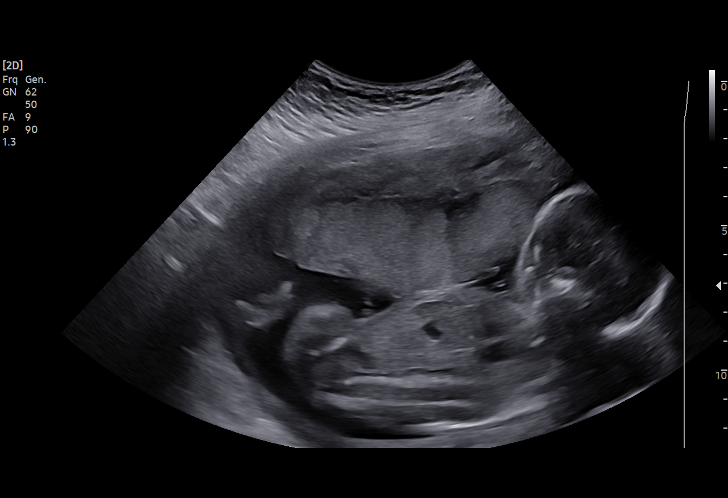
[im 11/97]
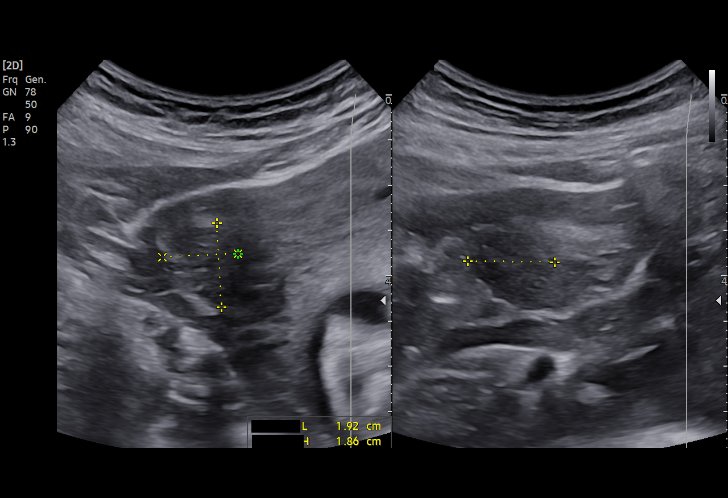
[im 18/97]
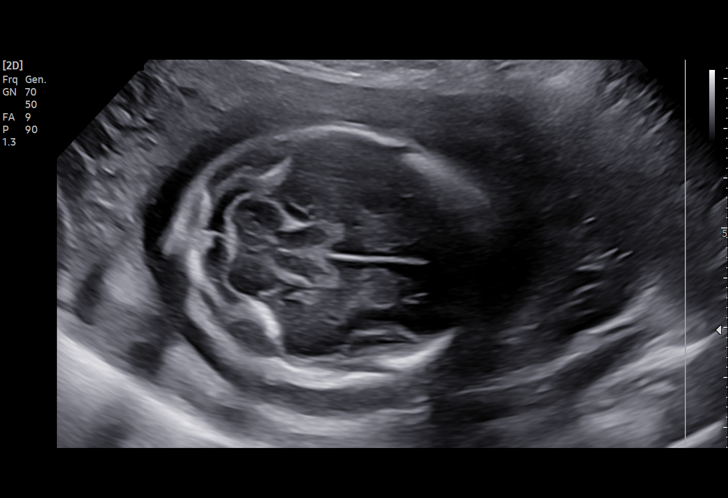
[im 25/97]
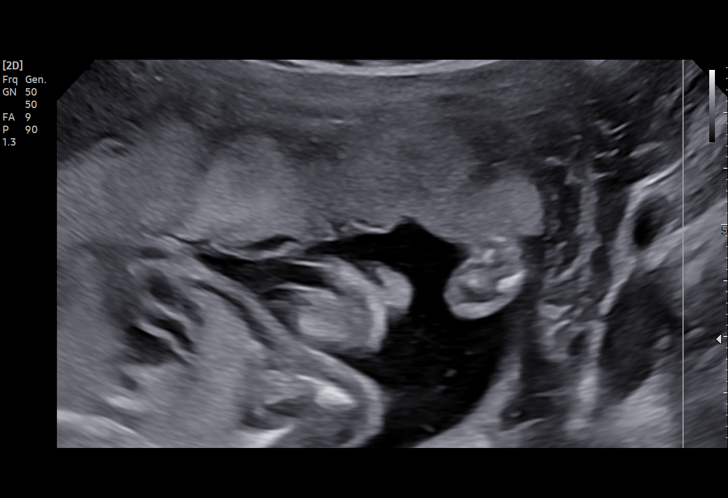
[im 33/97]
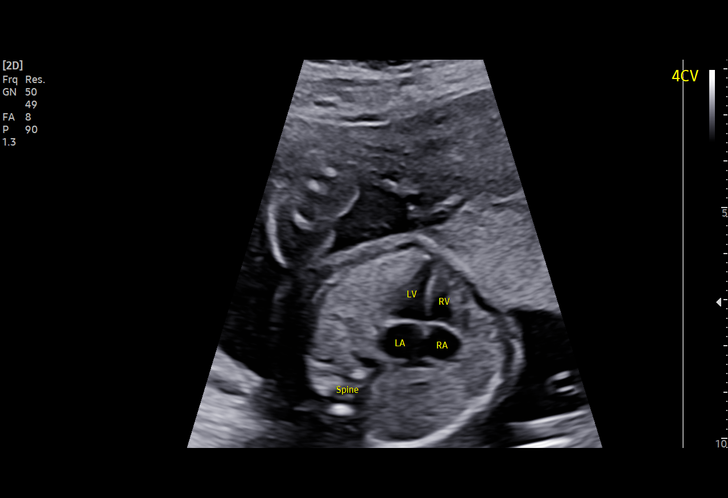
[im 40/97]
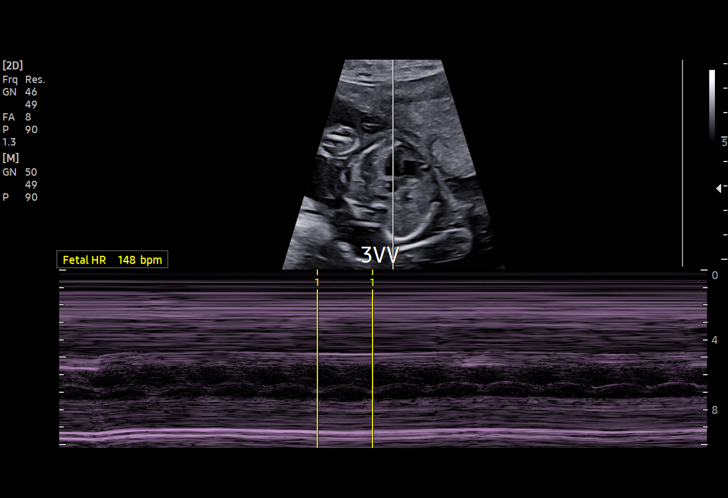
[im 50/97]
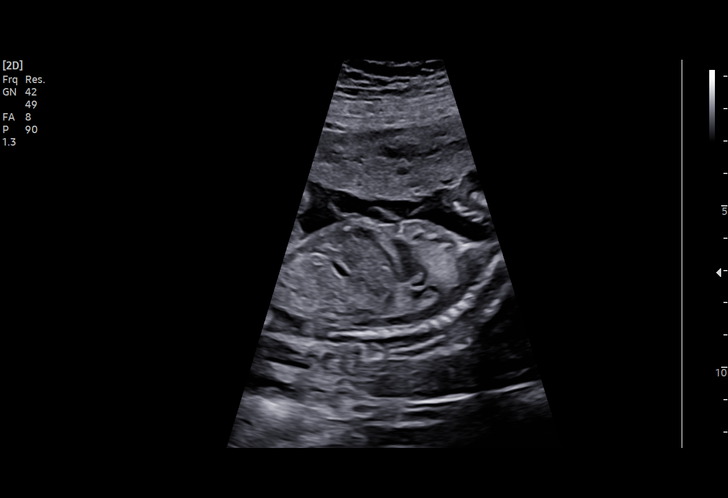
[im 57/97]
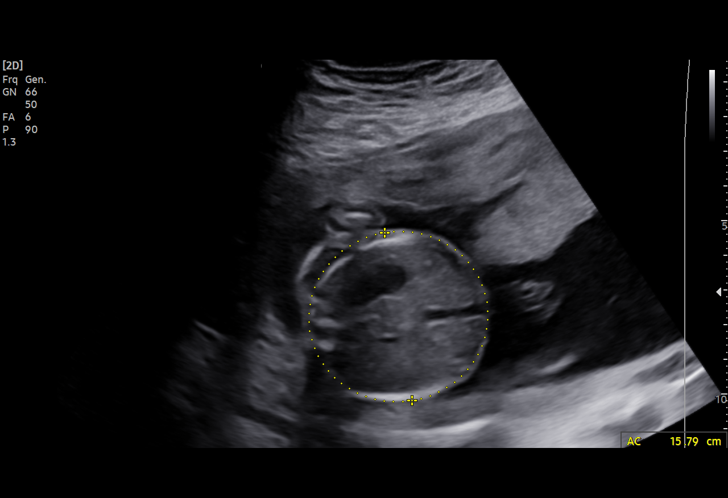
[im 65/97]
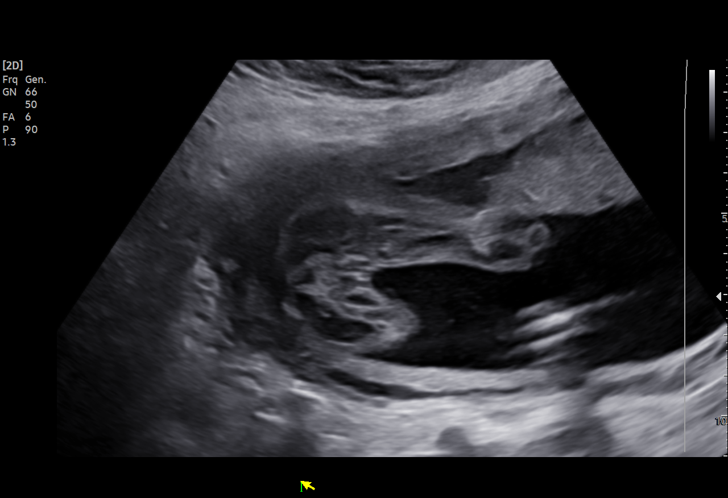
[im 72/97]
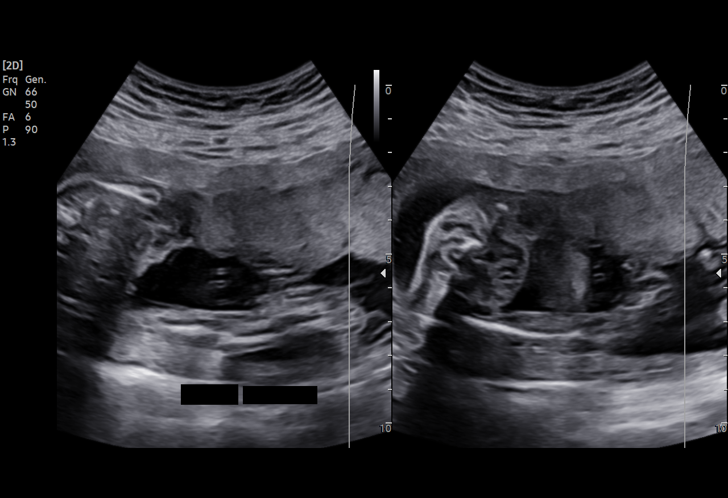
[im 79/97]
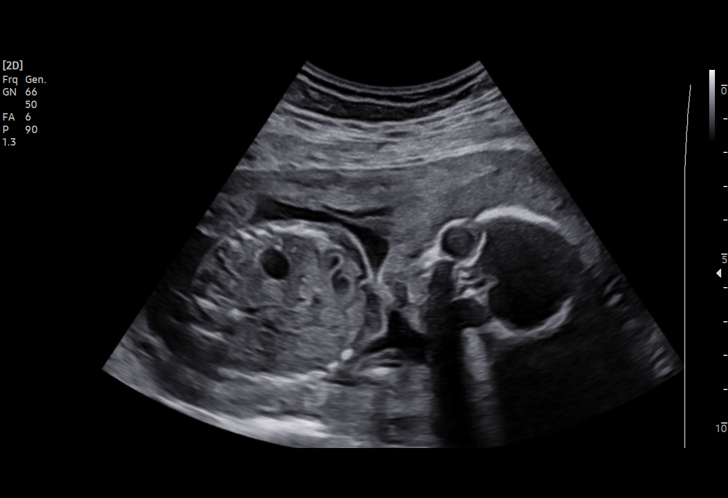
[im 86/97]
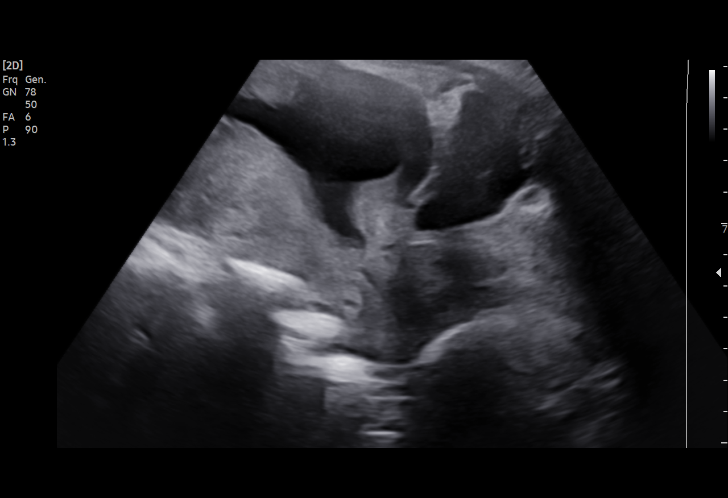
[im 93/97]
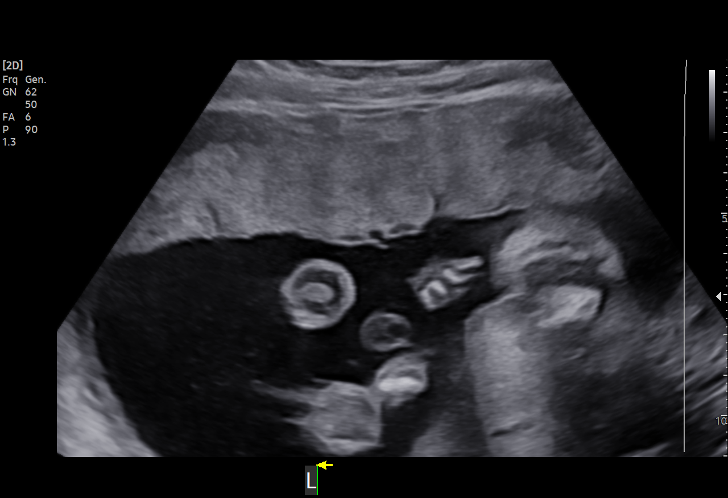

[13 of 28 positions shown; findings below may reference images not displayed]

WHNP

Indications

 Advanced maternal age multigravida 35+,
 second trimester
 20 weeks gestation of pregnancy
 Antenatal screening for malformations
Fetal Evaluation

 Num Of Fetuses:         1
 Fetal Heart Rate(bpm):  148
 Cardiac Activity:       Observed
 Presentation:           Cephalic
 Placenta:               Anterior
 P. Cord Insertion:      Visualized

 Amniotic Fluid
 AFI FV:      Within normal limits
Biometry

 BPD:      50.4  mm     G. Age:  21w 2d         77  %    CI:        77.37   %    70 - 86
                                                         FL/HC:      18.8   %    15.9 -
 HC:      181.4  mm     G. Age:  20w 4d         40  %    HC/AC:      1.15        1.06 -
 AC:      157.5  mm     G. Age:  20w 6d         55  %    FL/BPD:     67.7   %
 FL:       34.1  mm     G. Age:  20w 5d         47  %    FL/AC:      21.7   %    20 - 24
 CER:      21.3  mm     G. Age:  20w 1d         54  %
 NFT:       5.0  mm

 LV:          4  mm
 CM:        3.4  mm
 Est. FW:     378  gm    0 lb 13 oz      58  %
OB History

 Gravidity:    4         Term:   1        Prem:   0        SAB:   2
 TOP:          0       Ectopic:  0        Living: 1
Gestational Age

 LMP:           20w 4d        Date:  07/26/21                 EDD:   05/02/22
 U/S Today:     20w 6d                                        EDD:   04/30/22
 Best:          20w 4d     Det. By:  LMP  (07/26/21)          EDD:   05/02/22
Anatomy

 Cranium:               Appears normal         Aortic Arch:            Appears normal
 Cavum:                 Appears normal         Ductal Arch:            Appears normal
 Ventricles:            Appears normal         Diaphragm:              Appears normal
 Choroid Plexus:        Appears normal         Stomach:                Appears normal, left
                                                                       sided
 Cerebellum:            Appears normal         Abdomen:                Appears normal
 Posterior Fossa:       Appears normal         Abdominal Wall:         Appears nml (cord
                                                                       insert, abd wall)
 Nuchal Fold:           Appears normal         Cord Vessels:           Appears normal (3
                                                                       vessel cord)
 Face:                  Appears normal         Kidneys:                Appear normal
                        (orbits and profile)
 Lips:                  Appears normal         Bladder:                Appears normal
 Thoracic:              Appears normal         Spine:                  Appears normal
 Heart:                 Appears normal         Upper Extremities:      Appears normal
                        (4CH, axis, and
                        situs)
 RVOT:                  Appears normal         Lower Extremities:      Appears normal
 LVOT:                  Appears normal

 Other:  VC, 3VV and 3VTV visualized. Nasal bone, lenses, maxilla, mandible
         and falx visualized Heels/feet and 5th digits visualized. Fetus appears
         to be female.
Cervix Uterus Adnexa

 Cervix
 Length:           3.18  cm.
 Normal appearance by transabdominal scan.

 Uterus
 No abnormality visualized.

 Right Ovary
 Size(cm)     1.92   x   1.67   x  1.86      Vol(ml):
 Within normal limits.

 Left Ovary
 Size(cm)     2.38   x   1.9    x  1.6       Vol(ml):
 Within normal limits.
Impression

 Advanced maternal age.  Patient is here for fetal anatomy
 scan.  She had opted not to screen for fetal aneuploidies.
 Obstetric history is significant for a term cesarean delivery.

 We performed fetal anatomy scan. No makers of
 aneuploidies or fetal structural defects are seen. Fetal
 biometry is consistent with her previously-established dates.
 Amniotic fluid is normal and good fetal activity is seen.
 Placenta is anterior and there is no evidence of previa or
 placenta accreta spectrum.  Patient understands the
 limitations of ultrasound in detecting fetal anomalies.
Recommendations

 -An appointment was made for her to return in 12 weeks for
 fetal growth assessment.
                 Alonzo, Nazma
